# Patient Record
Sex: Female | Born: 1950 | Race: White | Hispanic: No | Marital: Married | Smoking: Never smoker
Health system: Southern US, Community
[De-identification: ages and names within clinical notes are randomized; demographics above are authoritative.]

## PROBLEM LIST (undated history)

## (undated) DIAGNOSIS — M169 Osteoarthritis of hip, unspecified: Secondary | ICD-10-CM

## (undated) DIAGNOSIS — E785 Hyperlipidemia, unspecified: Secondary | ICD-10-CM

## (undated) DIAGNOSIS — R55 Syncope and collapse: Secondary | ICD-10-CM

## (undated) HISTORY — DX: Hyperlipidemia, unspecified: E78.5

## (undated) HISTORY — DX: Osteoarthritis of hip, unspecified: M16.9

## (undated) HISTORY — PX: TOTAL HIP ARTHROPLASTY: SHX124

## (undated) HISTORY — DX: Syncope and collapse: R55

---

## 1975-06-05 HISTORY — PX: TONSILLECTOMY: SUR1361

## 1984-06-04 HISTORY — PX: OTHER SURGICAL HISTORY: SHX169

## 1999-08-24 ENCOUNTER — Encounter: Payer: Self-pay | Admitting: Family Medicine

## 2006-11-01 LAB — HM COLONOSCOPY: HM Colonoscopy: NEGATIVE

## 2008-09-22 ENCOUNTER — Ambulatory Visit: Payer: Self-pay | Admitting: Family Medicine

## 2008-09-22 DIAGNOSIS — E785 Hyperlipidemia, unspecified: Secondary | ICD-10-CM

## 2008-09-22 DIAGNOSIS — M169 Osteoarthritis of hip, unspecified: Secondary | ICD-10-CM

## 2008-11-03 ENCOUNTER — Ambulatory Visit: Payer: Self-pay | Admitting: Family Medicine

## 2009-01-06 ENCOUNTER — Encounter: Payer: Self-pay | Admitting: Family Medicine

## 2009-01-12 ENCOUNTER — Telehealth: Payer: Self-pay | Admitting: Family Medicine

## 2009-01-19 ENCOUNTER — Ambulatory Visit: Payer: Self-pay | Admitting: Family Medicine

## 2009-03-16 ENCOUNTER — Inpatient Hospital Stay (HOSPITAL_COMMUNITY): Admission: RE | Admit: 2009-03-16 | Discharge: 2009-03-19 | Payer: Self-pay | Admitting: Orthopedic Surgery

## 2009-06-17 ENCOUNTER — Telehealth: Payer: Self-pay | Admitting: Family Medicine

## 2009-06-17 ENCOUNTER — Encounter: Payer: Self-pay | Admitting: Family Medicine

## 2009-07-13 ENCOUNTER — Inpatient Hospital Stay (HOSPITAL_COMMUNITY): Admission: RE | Admit: 2009-07-13 | Discharge: 2009-07-16 | Payer: Self-pay | Admitting: Orthopedic Surgery

## 2009-08-26 ENCOUNTER — Ambulatory Visit: Payer: Self-pay | Admitting: Family Medicine

## 2009-08-26 DIAGNOSIS — S6000XA Contusion of unspecified finger without damage to nail, initial encounter: Secondary | ICD-10-CM

## 2009-12-01 LAB — HM PAP SMEAR: HM Pap smear: NORMAL

## 2010-02-02 ENCOUNTER — Ambulatory Visit: Payer: Self-pay | Admitting: Family Medicine

## 2010-02-02 DIAGNOSIS — K5900 Constipation, unspecified: Secondary | ICD-10-CM | POA: Insufficient documentation

## 2010-02-02 DIAGNOSIS — K219 Gastro-esophageal reflux disease without esophagitis: Secondary | ICD-10-CM

## 2010-07-04 NOTE — Assessment & Plan Note (Signed)
Summary: CONCERNS ABOUT POSSIBLE HERNIA // RS   Vital Signs:  Patient profile:   60 year old female Weight:      153 pounds Temp:     98.0 degrees F oral BP sitting:   138 / 92  (left arm) Cuff size:   regular  Vitals Entered By: Sid Falcon LPN (February 02, 2010 8:34 AM)  History of Present Illness: Patient seen with almost 2 month history of postprandial fullness.  She relates symptoms particularly worse at night and sometimes worse supine. She usually eats about 3-4 hours before going to bed. She has not had any nausea, vomiting, abdominal pain, hematemesis, or appetite or weight changes. No stool changes. Colonoscopy 2008.  Tends to eat heavier foods at night. No history of hiatal hernia. Occasional associated reflux symptoms. Symptoms also seem to be worse when she bends over. She has not tried any medications. Sometimes has associated increasing gas symptoms. No history of peptic ulcer disease  Occ constipation which is not new.  Doing well following bil hip replacements.  Ambulating much better.  Allergies: 1)  ! * Thimersol  Past History:  Past Medical History: Last updated: 10/25/2008 Osteoarthritis left hip Fainting spells with blood draw Hyperlipidemia  Past Surgical History: Last updated: 08/26/2009 Tonsillectomy 1977 Histogram 1986 Endometrosis 1986 Total Hip replacements 2010, 2011  Family History: Last updated: 09/22/2008 Family History of Arthritis  parent, grandparent Family History High cholesterol, patrent, grandparent Family History Hypertension, parent, grandparent  Social History: Last updated: 09/22/2008 Occupation:  housewife Married Regular exercise-yes  Risk Factors: Exercise: yes (09/22/2008) PMH-FH-SH reviewed for relevance  Review of Systems  The patient denies anorexia, fever, weight loss, hoarseness, chest pain, syncope, dyspnea on exertion, peripheral edema, prolonged cough, headaches, hemoptysis, abdominal pain, melena,  hematochezia, severe indigestion/heartburn, hematuria, and incontinence.    Physical Exam  General:  Well-developed,well-nourished,in no acute distress; alert,appropriate and cooperative throughout examination Head:  Normocephalic and atraumatic without obvious abnormalities. No apparent alopecia or balding. Mouth:  Oral mucosa and oropharynx without lesions or exudates.  Teeth in good repair. Neck:  No deformities, masses, or tenderness noted. Lungs:  Normal respiratory effort, chest expands symmetrically. Lungs are clear to auscultation, no crackles or wheezes. Heart:  Normal rate and regular rhythm. S1 and S2 normal without gallop, murmur, click, rub or other extra sounds. Abdomen:  soft, non-tender, normal bowel sounds, no distention, no masses, no guarding, no rigidity, no hepatomegaly, and no splenomegaly.     Impression & Recommendations:  Problem # 1:  ESOPHAGEAL REFLUX (ICD-530.81) discussed lifestyle changes.  Trial Nexium 40 mg with samples given.  Problem # 2:  CONSTIPATION (ICD-564.00) discussed prevention measures.  Problem # 3:  OSTEOARTHRITIS, HIPS, BILATERAL (ICD-715.95)  The following medications were removed from the medication list:    Meloxicam 15 Mg Tabs (Meloxicam) ..... One by mouth daily Her updated medication list for this problem includes:    Tramadol Hcl 50 Mg Tabs (Tramadol hcl) ..... One to two tabs by mouth q 6 hours prn  Complete Medication List: 1)  Glucosamine-chondroitin 250-500 Mg Caps (Glucosamine-chondroitin) .... 2 daily 2)  Caltrate 600+d Plus 600-400 Mg-unit Tabs (Calcium carbonate-vit d-min) .... Once daily 3)  Calcium 600/vitamin D 600-400 Mg-unit Tabs (Calcium carbonate-vitamin d) .... Once daily 4)  Tramadol Hcl 50 Mg Tabs (Tramadol hcl) .... One to two tabs by mouth q 6 hours prn 5)  Robaxin 500 Mg Tabs (Methocarbamol) .... One tab every 6 hours as needed  Patient Instructions: 1)  Avoid  eating within 4 hours of bedtime. 2)  Avoid  high fat foods at night. 3)  Nexium 40 mg one daily. 4)  We need to consider X-rays if you develop any abdmoinal pain, 5)  appetite changes, vomiting, or any unexplained weight loss.

## 2010-07-04 NOTE — Progress Notes (Signed)
Summary: Pt req letter of Medical Clearance for total rt hip replacement  Phone Note Call from Patient Call back at Home Phone [phone]   Caller: Patient Call For: Heather Peat MD Summary of Call: Pt is req to get Medical Clearance from Dr. Caryl Never, in order to have total right hip replacement surgery with Dr. Lequita Halt. Pt says that she just need a letter sent to Imelda Pillow w/ [facility] Phone # [phone] and fax # [phone] . Please advise. Please notify pt with status.   Initial call taken by: Lucy Antigua,  June 17, 2009 9:44 AM  Follow-up for Phone Call        written  Follow-up by: Heather Peat MD,  June 17, 2009 10:17 AM

## 2010-07-04 NOTE — Letter (Signed)
Summary: Generic Letter   at [facility]  [address] [place], [place] [address]   Phone: [phone]  Fax: [phone]           06/17/2009  [facility] [address] [place], [place]  [address]  Dear Ms. Kaneshiro,   Mrs. Sinatra is a patient of mine and she has recently brought to my attention that she is being scheduled for Total Hip Replacement.  As you are aware, she recently did well with the other side.  We are not aware of any contraindications for surgery at this time.  No new symptoms since her last surgery.  She did have mild post-op anemia from surgery in October and I would suggest pre-op hemoglobin.  We would be happy to assess CBC in our office if that would be helpful   Sincerely,      Evelena Peat, MD

## 2010-07-04 NOTE — Letter (Signed)
Summary: Generic Letter  [place] at [facility]  [address] [place], [place] [address]   Phone: [phone]  Fax: [phone]    06/17/2009 Regarding: Heather Adams [address] [place], [place]  [address]  To:  [facility]  [name] is a patient of mine and she has recently brought to my attention that she is being scheduled for Total Hip Replacement.  As you are aware, she recently did well with the other side.  We are not aware of any contraindications for surgery at this time.  No new symptoms since last surgery.  Did have mild post anemia from surgery in October and would suggest preop hemoglobin.  We would be happy to assess CBC in our office if that would be helpful.   Sincerely,   Evelena Peat MD

## 2010-07-04 NOTE — Letter (Signed)
Summary: Generic Letter  [place] at [facility]  [address] [place], [place] [address]   Phone: [phone]  Fax: [phone]            06/17/2009   Heather Adams [address] [place], [place]  [address]   To [facility]:  [name] is a patient of mine and she has recently brought to my attention that she is being scheduled for a Total Hip Replacement.  As you are aware, she recently did well with the other side.  We are not aware of any contraindication for surgery at this time.  No new symptoms since her last surgery.  She did have mild post-op anemia from surgery in October and I would suggest pre-op hemoglobin.  We would be happy to assess CBC in our office if that would be helpful.    Sincerely,       Evelena Peat, MD

## 2010-07-04 NOTE — Assessment & Plan Note (Signed)
Summary: SORE TOE ON L FOOT (INFECTED?) // RS   Vital Signs:  Patient profile:   60 year old female Weight:      141 pounds Temp:     98.1 degrees F oral BP sitting:   140 / 82  (left arm) Cuff size:   regular  Vitals Entered By: Sid Falcon LPN (August 26, 2009 1:56 PM) CC: left great toe pain   History of Present Illness: L great toe pain for 6 days afternoon incr yard work.  No injury.  Discoloration under nail.  No injury.  No redness.  No new shoes. Pain is minimal..  Recent THR and recovering well.  No hx diabetes.    Allergies: 1)  ! * Thimersol  Past History:  Past Medical History: Last updated: 10/25/2008 Osteoarthritis left hip Fainting spells with blood draw Hyperlipidemia  Social History: Last updated: 09/22/2008 Occupation:  housewife Married Regular exercise-yes  Past Surgical History: Tonsillectomy 1977 Histogram 1986 Endometrosis 1986 Total Hip replacements 2010, 2011 PMH reviewed for relevance, PSH reviewed for relevance  Review of Systems  The patient denies fever.    Physical Exam  General:  Well-developed,well-nourished,in no acute distress; alert,appropriate and cooperative throughout examination Lungs:  Normal respiratory effort, chest expands symmetrically. Lungs are clear to auscultation, no crackles or wheezes. Heart:  Normal rate and regular rhythm. S1 and S2 normal without gallop, murmur, click, rub or other extra sounds. Extremities:  L great toe small subungual hematoma.  Nontender.  no surrounding erythema.   Impression & Recommendations:  Problem # 1:  SUBUNGUAL HEMATOMA (ICD-923.3) reassurance.  Offered drainage but no signs of infection and no signif pain so will observe.  Complete Medication List: 1)  Meloxicam 15 Mg Tabs (Meloxicam) .... One by mouth daily 2)  Glucosamine-chondroitin 250-500 Mg Caps (Glucosamine-chondroitin) .... 2 daily 3)  Caltrate 600+d Plus 600-400 Mg-unit Tabs (Calcium carbonate-vit d-min) ....  Once daily 4)  Calcium 600/vitamin D 600-400 Mg-unit Tabs (Calcium carbonate-vitamin d) .... Once daily 5)  Tramadol Hcl 50 Mg Tabs (Tramadol hcl) .... One to two tabs by mouth q 6 hours prn 6)  Robaxin 500 Mg Tabs (Methocarbamol) .... One tab every 6 hours as needed  Patient Instructions: 1)  Follow up if you notice any signs of infection such as redness or increased pain.

## 2010-08-23 LAB — CBC
HCT: 28.8 % — ABNORMAL LOW (ref 36.0–46.0)
HCT: 39.3 % (ref 36.0–46.0)
Hemoglobin: 11 g/dL — ABNORMAL LOW (ref 12.0–15.0)
Hemoglobin: 9.6 g/dL — ABNORMAL LOW (ref 12.0–15.0)
MCHC: 33.9 g/dL (ref 30.0–36.0)
MCHC: 34.8 g/dL (ref 30.0–36.0)
MCV: 87.5 fL (ref 78.0–100.0)
MCV: 87.9 fL (ref 78.0–100.0)
Platelets: 192 10*3/uL (ref 150–400)
Platelets: 195 10*3/uL (ref 150–400)
Platelets: 255 10*3/uL (ref 150–400)
RBC: 3.31 MIL/uL — ABNORMAL LOW (ref 3.87–5.11)
RBC: 3.67 MIL/uL — ABNORMAL LOW (ref 3.87–5.11)
RDW: 14.4 % (ref 11.5–15.5)
WBC: 4.4 10*3/uL (ref 4.0–10.5)
WBC: 7 10*3/uL (ref 4.0–10.5)

## 2010-08-23 LAB — URINALYSIS, ROUTINE W REFLEX MICROSCOPIC
Nitrite: NEGATIVE
Protein, ur: NEGATIVE mg/dL
Specific Gravity, Urine: 1.01 (ref 1.005–1.030)
Urobilinogen, UA: 0.2 mg/dL (ref 0.0–1.0)

## 2010-08-23 LAB — BASIC METABOLIC PANEL
BUN: 7 mg/dL (ref 6–23)
BUN: 7 mg/dL (ref 6–23)
Calcium: 8.1 mg/dL — ABNORMAL LOW (ref 8.4–10.5)
Calcium: 8.5 mg/dL (ref 8.4–10.5)
Chloride: 104 mEq/L (ref 96–112)
Chloride: 107 mEq/L (ref 96–112)
GFR calc Af Amer: >60 mL/min (ref >60)
Glucose, Bld: 120 mg/dL — ABNORMAL HIGH (ref 70–99)
Glucose, Bld: 164 mg/dL — ABNORMAL HIGH (ref 70–99)
Potassium: 4.2 mEq/L (ref 3.5–5.1)

## 2010-08-23 LAB — COMPREHENSIVE METABOLIC PANEL
AST: 19 U/L (ref 0–37)
BUN: 10 mg/dL (ref 6–23)
CO2: 28 mEq/L (ref 19–32)
Calcium: 9.5 mg/dL (ref 8.4–10.5)
Creatinine, Ser: 0.77 mg/dL (ref 0.4–1.2)
Glucose, Bld: 74 mg/dL (ref 70–99)
Potassium: 4.3 mEq/L (ref 3.5–5.1)
Sodium: 138 mEq/L (ref 135–145)
Total Protein: 7.5 g/dL (ref 6.0–8.3)

## 2010-08-23 LAB — URINE MICROSCOPIC-ADD ON

## 2010-08-23 LAB — PROTIME-INR
INR: 1.48 (ref 0.00–1.49)
Prothrombin Time: 12.9 seconds (ref 11.6–15.2)
Prothrombin Time: 14 seconds (ref 11.6–15.2)
Prothrombin Time: 17.8 seconds — ABNORMAL HIGH (ref 11.6–15.2)

## 2010-08-23 LAB — TYPE AND SCREEN

## 2010-08-23 LAB — APTT: aPTT: 27 seconds (ref 24–37)

## 2010-09-07 LAB — COMPREHENSIVE METABOLIC PANEL
AST: 20 U/L (ref 0–37)
Albumin: 4.3 g/dL (ref 3.5–5.2)
Calcium: 9.8 mg/dL (ref 8.4–10.5)
Chloride: 104 mEq/L (ref 96–112)
Creatinine, Ser: 0.84 mg/dL (ref 0.4–1.2)
GFR calc Af Amer: >60 mL/min (ref >60)
Total Bilirubin: 1.1 mg/dL (ref 0.3–1.2)

## 2010-09-07 LAB — CBC
HCT: 28.6 % — ABNORMAL LOW (ref 36.0–46.0)
HCT: 29.3 % — ABNORMAL LOW (ref 36.0–46.0)
Hemoglobin: 9.7 g/dL — ABNORMAL LOW (ref 12.0–15.0)
Hemoglobin: 10.1 g/dL — ABNORMAL LOW (ref 12.0–15.0)
MCHC: 34.4 g/dL (ref 30.0–36.0)
MCV: 90.5 fL (ref 78.0–100.0)
MCV: 90.2 fL (ref 78.0–100.0)
MCV: 90.3 fL (ref 78.0–100.0)
MCV: 90.4 fL (ref 78.0–100.0)
Platelets: 177 10*3/uL (ref 150–400)
Platelets: 266 10*3/uL (ref 150–400)
RDW: 12.7 % (ref 11.5–15.5)
RDW: 13.1 % (ref 11.5–15.5)
WBC: 4.6 10*3/uL (ref 4.0–10.5)
WBC: 5.2 10*3/uL (ref 4.0–10.5)

## 2010-09-07 LAB — PROTIME-INR
INR: 1.86 — ABNORMAL HIGH (ref 0.00–1.49)
INR: 1.59 — ABNORMAL HIGH (ref 0.00–1.49)
Prothrombin Time: 21.3 seconds — ABNORMAL HIGH (ref 11.6–15.2)

## 2010-09-07 LAB — URINALYSIS, ROUTINE W REFLEX MICROSCOPIC
Nitrite: NEGATIVE
Specific Gravity, Urine: 1.014 (ref 1.005–1.030)
pH: 5.5 (ref 5.0–8.0)

## 2010-09-07 LAB — URINE MICROSCOPIC-ADD ON

## 2010-09-07 LAB — BASIC METABOLIC PANEL
BUN: 5 mg/dL — ABNORMAL LOW (ref 6–23)
CO2: 27 mEq/L (ref 19–32)
Calcium: 8.2 mg/dL — ABNORMAL LOW (ref 8.4–10.5)
Chloride: 103 mEq/L (ref 96–112)
Chloride: 108 mEq/L (ref 96–112)
Glucose, Bld: 120 mg/dL — ABNORMAL HIGH (ref 70–99)
Glucose, Bld: 144 mg/dL — ABNORMAL HIGH (ref 70–99)
Potassium: 4.7 mEq/L (ref 3.5–5.1)
Sodium: 137 mEq/L (ref 135–145)

## 2010-09-07 LAB — APTT: aPTT: 25 seconds (ref 24–37)

## 2010-09-20 ENCOUNTER — Other Ambulatory Visit (INDEPENDENT_AMBULATORY_CARE_PROVIDER_SITE_OTHER): Payer: BC Managed Care – PPO | Admitting: Family Medicine

## 2010-09-20 DIAGNOSIS — Z0000 Encounter for general adult medical examination without abnormal findings: Secondary | ICD-10-CM

## 2010-09-20 DIAGNOSIS — E785 Hyperlipidemia, unspecified: Secondary | ICD-10-CM

## 2010-09-20 LAB — CBC WITH DIFFERENTIAL/PLATELET
Basophils Absolute: 0.1 10*3/uL (ref 0.0–0.1)
Eosinophils Absolute: 0.1 10*3/uL (ref 0.0–0.7)
Lymphocytes Relative: 37.2 % (ref 12.0–46.0)
MCHC: 34.2 g/dL (ref 30.0–36.0)
MCV: 89.2 fl (ref 78.0–100.0)
Monocytes Absolute: 0.3 10*3/uL (ref 0.1–1.0)
Neutrophils Relative %: 50.8 % (ref 43.0–77.0)
Platelets: 240 10*3/uL (ref 150.0–400.0)
RBC: 4.41 Mil/uL (ref 3.87–5.11)
RDW: 13.9 % (ref 11.5–14.6)

## 2010-09-20 LAB — BASIC METABOLIC PANEL
BUN: 12 mg/dL (ref 6–23)
CO2: 27 mEq/L (ref 19–32)
Calcium: 9.3 mg/dL (ref 8.4–10.5)
Creatinine, Ser: 0.9 mg/dL (ref 0.4–1.2)
Glucose, Bld: 80 mg/dL (ref 70–99)

## 2010-09-20 LAB — LIPID PANEL
HDL: 47.2 mg/dL (ref >39.00)
Triglycerides: 156 mg/dL — ABNORMAL HIGH (ref 0.0–149.0)
VLDL: 31.2 mg/dL (ref 0.0–40.0)

## 2010-09-20 LAB — POCT URINALYSIS DIPSTICK
Ketones, UA: NEGATIVE
Protein, UA: NEGATIVE
Spec Grav, UA: 1.025
Urobilinogen, UA: 0.2
pH, UA: 5

## 2010-09-20 LAB — HEPATIC FUNCTION PANEL
Alkaline Phosphatase: 63 U/L (ref 39–117)
Bilirubin, Direct: 0.1 mg/dL (ref 0.0–0.3)

## 2010-09-20 LAB — TSH: TSH: 1.67 u[IU]/mL (ref 0.35–5.50)

## 2010-10-02 ENCOUNTER — Ambulatory Visit (INDEPENDENT_AMBULATORY_CARE_PROVIDER_SITE_OTHER): Payer: BC Managed Care – PPO | Admitting: Family Medicine

## 2010-10-02 ENCOUNTER — Encounter: Payer: Self-pay | Admitting: Family Medicine

## 2010-10-02 DIAGNOSIS — Z0000 Encounter for general adult medical examination without abnormal findings: Secondary | ICD-10-CM

## 2010-10-02 MED ORDER — TETANUS-DIPHTH-ACELL PERTUSSIS 5-2.5-18.5 LF-MCG/0.5 IM SUSP: 0.5000 mL | Freq: Once | INTRAMUSCULAR | Status: DC

## 2010-10-02 NOTE — Progress Notes (Signed)
  Subjective:    Patient ID: Heather Adams, female    DOB: 09/04/1950, 60 y.o.   MRN: [id number]  HPI Patient seen for complete physical examination. She has had degenerative arthritis both hips and prior total hip replacements. Starting to exercise more. Colonoscopy 2008. She sees gynecologist regularly. Pap smear and mammogram last June reportedly normal. She takes no prescription medications. We're trying to confirm last tetanus at this time. No indications for Pneumovax yet.   Review of Systems  Constitutional: Negative for fever, activity change, appetite change and fatigue.  HENT: Negative for hearing loss, ear pain, sore throat and trouble swallowing.   Eyes: Negative for visual disturbance.  Respiratory: Negative for cough and shortness of breath.   Cardiovascular: Negative for chest pain and palpitations.  Gastrointestinal: Negative for abdominal pain, diarrhea, constipation and blood in stool.  Genitourinary: Negative for dysuria and hematuria.  Musculoskeletal: Negative for myalgias, back pain and arthralgias.  Skin: Negative for rash.  Neurological: Negative for dizziness, syncope and headaches.  Hematological: Negative for adenopathy.  Psychiatric/Behavioral: Negative for confusion and dysphoric mood.       Objective:   Physical Exam  Constitutional: She is oriented to person, place, and time. She appears well-developed and well-nourished.  HENT:  Head: Normocephalic and atraumatic.  Eyes: EOM are normal. Pupils are equal, round, and reactive to light.  Neck: Normal range of motion. Neck supple. No thyromegaly present.  Cardiovascular: Normal rate, regular rhythm and normal heart sounds.   No murmur heard. Pulmonary/Chest: Breath sounds normal. No respiratory distress. She has no wheezes. She has no rales.  Abdominal: Soft. Bowel sounds are normal. She exhibits no distension and no mass. There is no tenderness. There is no rebound and no guarding.  Genitourinary:   per GYN  Musculoskeletal: Normal range of motion. She exhibits no edema.  Lymphadenopathy:    She has no cervical adenopathy.  Neurological: She is alert and oriented to person, place, and time. She displays normal reflexes. No cranial nerve deficit.  Skin: No rash noted.  Psychiatric: She has a normal mood and affect. Her behavior is normal. Judgment and thought content normal.          Assessment & Plan:  Complete physical. Labs reviewed with patient. Elevated lipids. She prefers to work on weight loss and reduction of saturated fats and repeat lipids in 6 months. Confirm date of last tetanus.

## 2010-10-02 NOTE — Patient Instructions (Signed)
Low-Fat, Low-Saturated-Fat, Low-Cholesterol Diets Food Selection Guide BREADS, CEREALS, PASTA, RICE, DRIED PEAS, AND BEANS These products are high in carbohydrates and most are low in fat. Therefore, they can be increased in the diet as substitutes for fatty foods. They too, however, contain calories and should not be eaten in excess. Cereals can be eaten for snacks as well as for breakfast.  Include foods that contain fiber (fruits, vegetables, whole grains, and legumes). Research shows that fiber may lower blood cholesterol levels, especially the water-soluble fiber found in fruits, vegetables, oat products, and legumes. FRUITS AND VEGETABLES It is good to eat fruits and vegetables. Besides being sources of fiber, both are rich in vitamins and some minerals. They help you get the daily allowances of these nutrients. Fruits and vegetables can be used for snacks and desserts. MEATS Limit lean meat, chicken, [place], and fish to no more than 6 ounces per day. Beef, Pork, and Lamb  Use lean cuts of beef, pork, and lamb. Lean cuts include:   Extra-lean ground beef.   Arm roast.   Sirloin tip.   Center-cut ham.   Round steak.   Loin chops.   Rump roast.   Tenderloin.  Trim all fat off the outside of meats before cooking. It is not necessary to severely decrease the intake of red meat, but lean choices should be made. Lean meat is rich in protein and contains a highly absorbable form of iron. Premenopausal women, in particular, should avoid reducing lean red meat because this could increase the risk for low red blood cells (iron-deficiency anemia). Processed Meats Processed meats, such as bacon, bologna, salami, sausage, and hot dogs contain large quantities of fat, are not rich in valuable nutrients, and should not be eaten very often. Organ Meats The organ meats, such as liver, sweetbreads, kidneys, and brain are very rich in cholesterol. They should be limited. Chicken and [place] These  are good sources of protein. The fat of poultry can be reduced by removing the skin and underlying fat layers before cooking. Chicken and [place] can be substituted for lean red meat in the diet. Poultry should not be fried or covered with high-fat sauces. Fish and Shellfish Fish is a good source of protein. Shellfish contain cholesterol, but they usually are low in saturated fatty acids. The preparation of fish is important. Like chicken and [place], they should not be fried or covered with high-fat sauces. EGGS Egg yolks often are hidden in cooked and processed foods. Egg whites contain no fat or cholesterol. They can be eaten often. Try 1 to 2 egg whites instead of whole eggs in recipes or use egg substitutes that do not contain yolk. MILK AND DAIRY PRODUCTS Use skim or 1% milk instead of 2% or whole milk. Decrease whole milk, natural, and processed cheeses. Use nonfat or low-fat (2%) cottage cheese or low-fat cheeses made from vegetable oils. Choose nonfat or low-fat (1 to 2%) yogurt. Experiment with evaporated skim milk in recipes that call for heavy cream. Substitute low-fat yogurt or low-fat cottage cheese for sour cream in dips and salad dressings. Have at least 2 servings of low-fat dairy products, such as 2 glasses of skim (or 1%) milk each day to help get your daily calcium intake. FATS AND OILS Reduce the total intake of fats, especially saturated fat. Butterfat, lard, and beef fats are high in saturated fat and cholesterol. These should be avoided as much as possible. Vegetable fats do not contain cholesterol, but certain vegetable fats, such as  coconut oil, palm oil, and palm kernel oil are very high in saturated fats. These should be limited. These fats are often used in bakery goods, processed foods, popcorn, oils, and nondairy creamers. Vegetable shortenings and some peanut butters contain hydrogenated oils, which are also saturated fats. Read the labels on these foods and check for saturated  vegetable oils. Unsaturated vegetable oils and fats do not raise blood cholesterol. However, they should be limited because they are fats and are high in calories. Total fat should still be limited to 30% of your daily caloric intake. Desirable liquid vegetable oils are corn oil, cottonseed oil, olive oil, canola oil, safflower oil, soybean oil, and sunflower oil. Peanut oil is not as good, but small amounts are acceptable. Buy a heart-healthy tub margarine that has no partially hydrogenated oils in the ingredients. Mayonnaise and salad dressings often are made from unsaturated fats, but they should also be limited because of their high calorie and fat content. Seeds, nuts, peanut butter, olives, and avocados are high in fat, but the fat is mainly the unsaturated type. These foods should be limited mainly to avoid excess calories and fat. OTHER EATING TIPS Snacks  Most sweets should be limited as snacks. They tend to be rich in calories and fats, and their caloric content outweighs their nutritional value. Some good choices in snacks are graham crackers, melba toast, soda crackers, bagels (no egg), English muffins, fruits, and vegetables. These snacks are preferable to snack crackers, [place] fries, and chips. Popcorn should be air-popped or cooked in small amounts of liquid vegetable oil. Desserts Eat fruit, low-fat yogurt, and fruit ices instead of pastries, cake, and cookies. Sherbet, angel food cake, gelatin dessert, frozen low-fat yogurt, or other frozen products that do not contain saturated fat (pure fruit juice bars, frozen ice pops) are also acceptable.  COOKING METHODS Choose those methods that use little or no fat. They include:  Poaching.   Braising.   Steaming.   Grilling.   Baking.   Stir-frying.   Broiling.   Microwaving.  Foods can be cooked in a nonstick pan without added fat, or use a nonfat cooking spray in regular cookware. Limit fried foods and avoid frying in saturated  fat. Add moisture to lean meats by using water, broth, cooking wines, and other nonfat or low-fat sauces along with the cooking methods mentioned above. Soups and stews should be chilled after cooking. The fat that forms on top after a few hours in the refrigerator should be skimmed off. When preparing meals, avoid using excess salt. Salt can contribute to raising blood pressure in some people. EATING AWAY FROM HOME Order entres, potatoes, and vegetables without sauces or butter. When meat exceeds the size of a deck of cards (3 to 4 ounces), the rest can be taken home for another meal. Choose vegetable or fruit salads and ask for low-calorie salad dressings to be served on the side. Use dressings sparingly. Limit high-fat toppings, such as bacon, crumbled eggs, cheese, sunflower seeds, and olives. Ask for heart-healthy tub margarine instead of butter. Document Released: 11/10/2001 Document Re-Released: 08/15/2009 [facility] Patient Information 2011 [place], [place].  Consider repeat lipids in 6 months.

## 2010-11-11 IMAGING — CR DG HIP 1V PORT*L*
1 series · 1 of 1 positions shown · non-contrast
Comparison: 03/10/2009

CLINICAL DATA: OA left hip. left THA

PORTABLE LEFT HIP - 1 VIEW

[view not recorded]
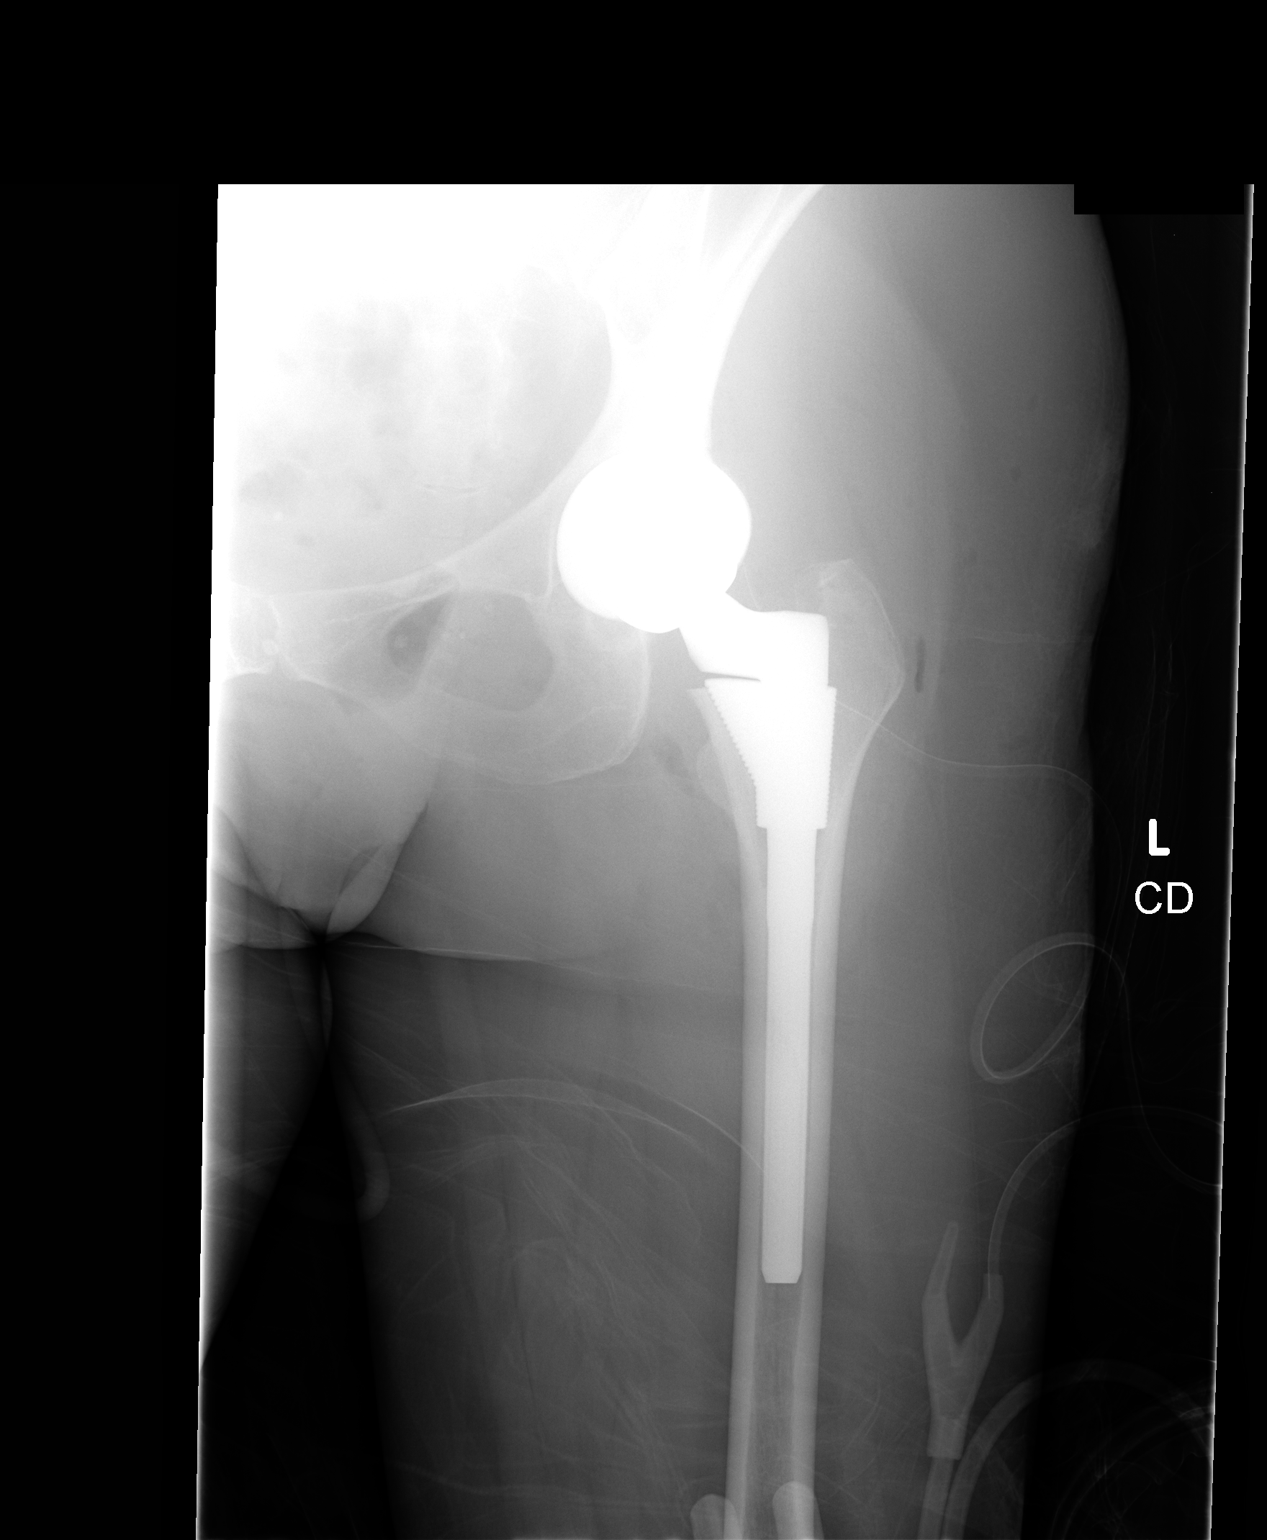

[1 of 1 positions shown; findings below may reference images not displayed]

FINDINGS: Left acetabular and femoral prosthetic components appear
in satisfactory position and alignment.  Radiopaque drain is noted.
IMPRESSION: Satisfactory appearance of left total hip replacement.

## 2011-02-09 ENCOUNTER — Encounter: Payer: Self-pay | Admitting: Family Medicine

## 2011-02-09 ENCOUNTER — Ambulatory Visit (INDEPENDENT_AMBULATORY_CARE_PROVIDER_SITE_OTHER): Payer: BC Managed Care – PPO | Admitting: Family Medicine

## 2011-02-09 VITALS — BP 120/80 | Temp 98.5°F | Wt 140.0 lb

## 2011-02-09 DIAGNOSIS — J310 Chronic rhinitis: Secondary | ICD-10-CM

## 2011-02-09 DIAGNOSIS — J3489 Other specified disorders of nose and nasal sinuses: Secondary | ICD-10-CM

## 2011-02-09 MED ORDER — AMOXICILLIN 875 MG PO TABS: 875.0000 mg | ORAL_TABLET | Freq: Two times a day (BID) | ORAL | Status: AC

## 2011-02-09 NOTE — Progress Notes (Signed)
  Subjective:    Patient ID: Heather Adams, female    DOB: 1951/01/06, 60 y.o.   MRN: [id number]  HPI Patient presents with some intermittent sinus issues over the past few weeks. Occasional left ear pain. She's had occasional pain around the left eye improved with Sudafed. She has postnasal drainage and occasional sneezing. No fever. No purulent nasal discharge. No significant cough.  Review of Systems  Constitutional: Negative for fever and chills.  HENT: Positive for congestion, sneezing, postnasal drip and sinus pressure.   Respiratory: Negative for cough, shortness of breath and wheezing.        Objective:   Physical Exam  Constitutional: She appears well-developed and well-nourished.  HENT:  Right Ear: External ear normal.  Left Ear: External ear normal.  Nose: Nose normal.  Mouth/Throat: Oropharynx is clear and moist.  Neck: Neck supple.  Cardiovascular: Normal rate and regular rhythm.   Pulmonary/Chest: Effort normal and breath sounds normal. No respiratory distress. She has no wheezes. She has no rales.  Lymphadenopathy:    She has no cervical adenopathy.          Assessment & Plan:  Probable eustachian tube dysfunction and allergic rhinitis. Continue antihistamine and Sudafed as needed. Followup promptly for signs of secondary infection

## 2011-02-09 NOTE — Patient Instructions (Signed)
Continue with over the counter sudafed and consider addition of Allegra or Zyrtec. Start Amoxicillin for any fever, pus like drainage from nose, or any localizing facial pain.

## 2011-03-10 IMAGING — CR DG PORTABLE PELVIS
1 series · 1 of 1 positions shown · non-contrast
Comparison: 03/16/2009

CLINICAL DATA: Right hip arthroplasty

PORTABLE PELVIS

[view not recorded]
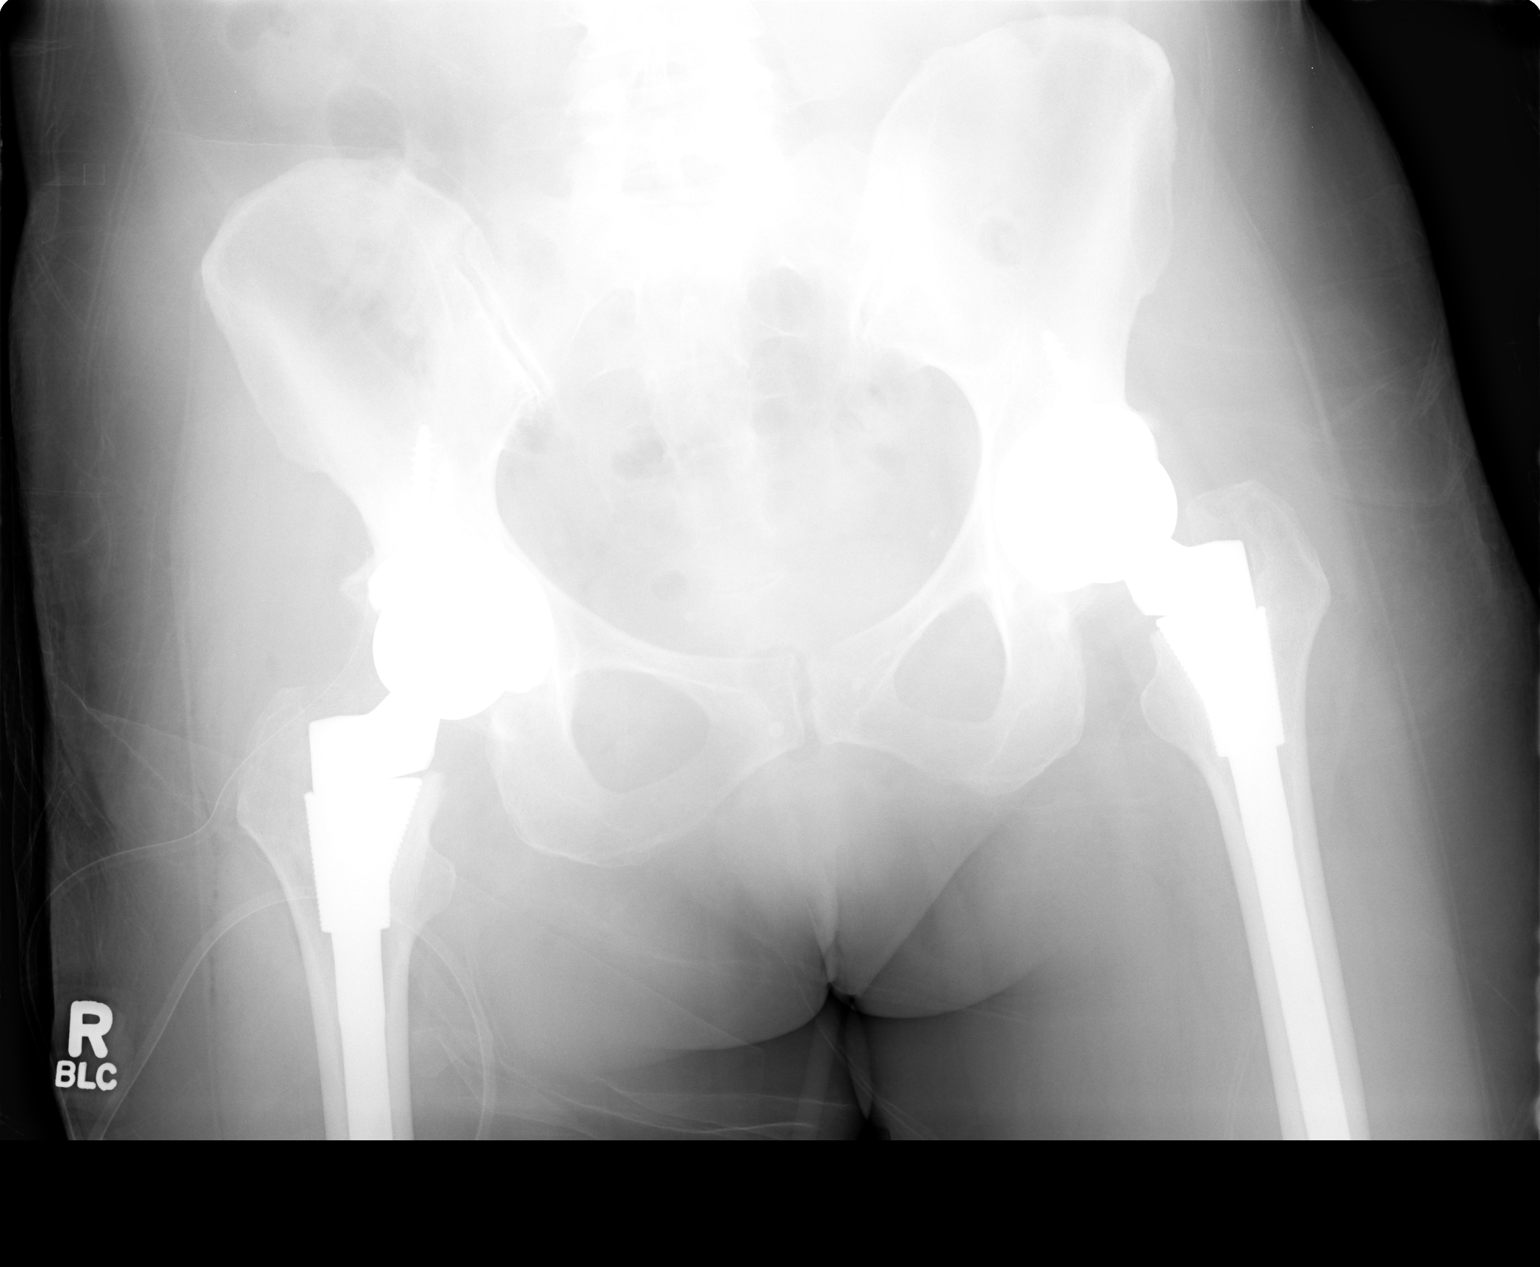

[1 of 1 positions shown; findings below may reference images not displayed]

FINDINGS: Changes of interval right hip arthroplasty.  Femoral and
acetabular components on the right articulate normally in this
single projection.  The distal tip of femoral stem is not
visualized on the pelvic radiograph.  Left hip arthroplasty
components are stable.
IMPRESSION: 1.  Interval right hip arthroplasty without apparent complication.

## 2011-06-08 ENCOUNTER — Ambulatory Visit (INDEPENDENT_AMBULATORY_CARE_PROVIDER_SITE_OTHER): Payer: BC Managed Care – PPO | Admitting: Family Medicine

## 2011-06-08 ENCOUNTER — Encounter: Payer: Self-pay | Admitting: Family Medicine

## 2011-06-08 VITALS — BP 130/92 | Temp 98.1°F | Wt 142.0 lb

## 2011-06-08 DIAGNOSIS — R1013 Epigastric pain: Secondary | ICD-10-CM

## 2011-06-08 DIAGNOSIS — K219 Gastro-esophageal reflux disease without esophagitis: Secondary | ICD-10-CM

## 2011-06-08 MED ORDER — PANTOPRAZOLE SODIUM 40 MG PO TBEC: 40.0000 mg | DELAYED_RELEASE_TABLET | Freq: Every day | ORAL | Status: DC

## 2011-06-08 NOTE — Patient Instructions (Signed)
Diet for GERD or PUD Nutrition therapy can help ease the discomfort of gastroesophageal reflux disease (GERD) and peptic ulcer disease (PUD).  HOME CARE INSTRUCTIONS   Eat your meals slowly, in a relaxed setting.   Eat 5 to 6 small meals per day.   If a food causes distress, stop eating it for a period of time.  FOODS TO AVOID  Coffee, regular or decaffeinated.   Cola beverages, regular or low calorie.   Tea, regular or decaffeinated.   Pepper.   Cocoa.   High fat foods, including meats.   Butter, margarine, hydrogenated oil (trans fats).   Peppermint or spearmint (if you have GERD).   Fruits and vegetables if not tolerated.   Alcohol.   Nicotine (smoking or chewing). This is one of the most potent stimulants to acid production in the gastrointestinal tract.   Any food that seems to aggravate your condition.  If you have questions regarding your diet, ask your caregiver or a registered dietitian. TIPS  Lying flat may make symptoms worse. Keep the head of your bed raised 6 to 9 inches (15 to 23 cm) by using a foam wedge or blocks under the legs of the bed.   Do not lay down until 3 hours after eating a meal.   Daily physical activity may help reduce symptoms.  MAKE SURE YOU:   Understand these instructions.   Will watch your condition.   Will get help right away if you are not doing well or get worse.  Document Released: 05/21/2005 Document Revised: 01/31/2011 Document Reviewed: 10/04/2008 [employer] Patient Information 2012 [employer]. 

## 2011-06-08 NOTE — Progress Notes (Signed)
  Subjective:    Patient ID: Heather Adams, female    DOB: 10-Aug-1950, 61 y.o.   MRN: [id number]  HPI  Concern for possible symptomatic hiatal hernia raised by patiet. Last weekend patient developed some epigastric pain. Described as achy quality. Very transient. Some radiation toward back. No chest pain. Improved following Nexium which was left over. She denies any melena. No nausea or vomiting. No recent nonsteroidal use. No exertional chest pain. No change of bowel habits. Denies melena. Reportedly history of hiatal hernia. No right upper quadrant pain. No symptoms past few days. Minimal caffeine use. No regular alcohol use.   Review of Systems  Constitutional: Negative for appetite change and unexpected weight change.  Respiratory: Negative for cough and shortness of breath.   Cardiovascular: Negative for chest pain, palpitations and leg swelling.  Gastrointestinal: Negative for blood in stool.  Neurological: Negative for dizziness.       Objective:   Physical Exam  Constitutional: She appears well-developed and well-nourished.  HENT:  Mouth/Throat: Oropharynx is clear and moist.  Neck: Neck supple.  Cardiovascular: Normal rate and regular rhythm.   Pulmonary/Chest: Effort normal and breath sounds normal. No respiratory distress. She has no wheezes. She has no rales.  Abdominal: Soft. Bowel sounds are normal. She exhibits no distension and no mass. There is no tenderness. There is no rebound and no guarding.  Lymphadenopathy:    She has no cervical adenopathy.          Assessment & Plan:  Transient epigastric pain. Question GERD. No evidence for cardiac etiology by history. Bland diet recommended. Trial of Protonix 40 mg daily for the next couple weeks. Follow up promptly for any recurrent symptoms or new symptoms

## 2011-11-28 LAB — HM PAP SMEAR: HM Pap smear: NEGATIVE

## 2012-01-17 HISTORY — PX: COLONOSCOPY: SHX174

## 2012-03-11 ENCOUNTER — Encounter: Payer: Self-pay | Admitting: Family Medicine

## 2012-03-11 ENCOUNTER — Ambulatory Visit (INDEPENDENT_AMBULATORY_CARE_PROVIDER_SITE_OTHER): Payer: BC Managed Care – PPO | Admitting: Family Medicine

## 2012-03-11 VITALS — BP 122/70 | Temp 98.0°F | Wt 140.0 lb

## 2012-03-11 DIAGNOSIS — M79675 Pain in left toe(s): Secondary | ICD-10-CM

## 2012-03-11 DIAGNOSIS — Z23 Encounter for immunization: Secondary | ICD-10-CM

## 2012-03-11 DIAGNOSIS — M79609 Pain in unspecified limb: Secondary | ICD-10-CM

## 2012-03-11 NOTE — Patient Instructions (Addendum)
Follow up for any increased toe redness, increased swelling, or any purulent drainage.

## 2012-03-11 NOTE — Progress Notes (Signed)
  Subjective:    Patient ID: Heather Adams, female    DOB: [birth year], 61 y.o.   MRN: [id number]  HPI  Left great toe pain. Yesterday she stubbed her toe with some mild loosening of the nail. No hematoma. Ambulating without difficulty. She has noticed some clear drainage today. No increased redness. No increased warmth. Tetanus last year.   Review of Systems  Constitutional: Negative for fever and chills.       Objective:   Physical Exam  Constitutional: She appears well-developed and well-nourished.  Cardiovascular: Normal rate and regular rhythm.   Musculoskeletal:       Left great toe reveals no evidence for subungual hematoma. No surrounding erythema or warmth involving the skin. No evidence for paronychia. Her toenail appears to be anchored fairly well. Minimal tenderness around nail matrix region.          Assessment & Plan:  Left great toenail pain. No evidence for subungual hematoma. No secondary infection. Hopefully, nail will remain attached. Followup promptly for signs secondary infection. Influenza vaccine given.

## 2012-08-26 ENCOUNTER — Other Ambulatory Visit (INDEPENDENT_AMBULATORY_CARE_PROVIDER_SITE_OTHER): Payer: Self-pay

## 2012-08-26 DIAGNOSIS — Z0000 Encounter for general adult medical examination without abnormal findings: Secondary | ICD-10-CM

## 2012-08-26 LAB — HEPATIC FUNCTION PANEL
ALT: 19 U/L (ref 0–35)
Alkaline Phosphatase: 62 U/L (ref 39–117)
Bilirubin, Direct: 0.1 mg/dL (ref 0.0–0.3)
Total Protein: 7.8 g/dL (ref 6.0–8.3)

## 2012-08-26 LAB — CBC WITH DIFFERENTIAL/PLATELET
Basophils Relative: 1.2 % (ref 0.0–3.0)
Eosinophils Absolute: 0.1 10*3/uL (ref 0.0–0.7)
Eosinophils Relative: 1.6 % (ref 0.0–5.0)
HCT: 42.5 % (ref 36.0–46.0)
Lymphs Abs: 1.4 10*3/uL (ref 0.7–4.0)
MCHC: 33.3 g/dL (ref 30.0–36.0)
MCV: 88.6 fl (ref 78.0–100.0)
Monocytes Absolute: 0.3 10*3/uL (ref 0.1–1.0)
Neutro Abs: 2.6 10*3/uL (ref 1.4–7.7)
RBC: 4.79 Mil/uL (ref 3.87–5.11)
WBC: 4.6 10*3/uL (ref 4.5–10.5)

## 2012-08-26 LAB — LIPID PANEL
Cholesterol: 310 mg/dL — ABNORMAL HIGH (ref 0–200)
HDL: 48.4 mg/dL (ref >39.00)
Total CHOL/HDL Ratio: 6
VLDL: 26 mg/dL (ref 0.0–40.0)

## 2012-08-26 LAB — BASIC METABOLIC PANEL
CO2: 27 mEq/L (ref 19–32)
Chloride: 102 mEq/L (ref 96–112)
Creatinine, Ser: 0.9 mg/dL (ref 0.4–1.2)
Potassium: 4.9 mEq/L (ref 3.5–5.1)

## 2012-08-26 LAB — POCT URINALYSIS DIPSTICK
Ketones, UA: NEGATIVE
Protein, UA: NEGATIVE
Spec Grav, UA: <=1.005

## 2012-08-27 ENCOUNTER — Encounter: Payer: Self-pay | Admitting: Family Medicine

## 2012-08-27 ENCOUNTER — Ambulatory Visit (INDEPENDENT_AMBULATORY_CARE_PROVIDER_SITE_OTHER): Payer: BC Managed Care – PPO | Admitting: Family Medicine

## 2012-08-27 VITALS — BP 130/70 | HR 72 | Temp 97.8°F | Resp 12 | Ht 61.75 in | Wt 130.0 lb

## 2012-08-27 DIAGNOSIS — Z0000 Encounter for general adult medical examination without abnormal findings: Secondary | ICD-10-CM

## 2012-08-27 NOTE — Progress Notes (Signed)
Subjective:    Patient ID: Heather Adams, female    DOB: 03-17-1951, 62 y.o.   MRN: [id number]  HPI Here for complete physical. She has history of hyperlipidemia and GERD Never treated for hyperlipidemia. Overall low risk for CAD. Patient had recent screening through her work with abdominal aortic aneurysm screening and carotid duplex screening which did not show any evidence for vascular disease.  Nonsmoker. No history of diabetes. No history of hypertension. Exercises regularly. Colonoscopy August 2013. No history of shingles vaccine. Sees gynecologist regularly. Pap smear within the past year normal. Gets yearly mammograms. Tetanus is up-to-date  History of osteoarthritis and total hip replacements previously. She has recovered very well and stays quite active.  Past Medical History  Diagnosis Date  . Osteoarthritis of hip     right  . Fainting spell     with blood draws   . Hyperlipidemia    Past Surgical History  Procedure Laterality Date  . Tonsillectomy  1977  . Hisogram  1986  . Endometrosis  1986  . Total hip arthroplasty  2010,2011    reports that she has never smoked. She does not have any smokeless tobacco history on file. Her alcohol and drug histories are not on file. family history includes Hearing loss in her paternal aunt, paternal grandfather, and paternal uncle; Heart disease (age of onset: 92) in her paternal grandfather; and Hyperlipidemia in her mother. Allergies  Allergen Reactions  . Thimerosal       Review of Systems  Constitutional: Negative for fever, activity change, appetite change, fatigue and unexpected weight change.  HENT: Negative for hearing loss, ear pain, sore throat and trouble swallowing.   Eyes: Negative for visual disturbance.  Respiratory: Negative for cough and shortness of breath.   Cardiovascular: Negative for chest pain and palpitations.  Gastrointestinal: Negative for abdominal pain, diarrhea, constipation and blood in  stool.  Genitourinary: Negative for dysuria and hematuria.  Musculoskeletal: Negative for myalgias, back pain and arthralgias.  Skin: Negative for rash.  Neurological: Negative for dizziness, syncope and headaches.  Hematological: Negative for adenopathy.  Psychiatric/Behavioral: Negative for confusion and dysphoric mood.       Objective:   Physical Exam  Constitutional: She is oriented to person, place, and time. She appears well-developed and well-nourished.  HENT:  Head: Normocephalic and atraumatic.  Eyes: EOM are normal. Pupils are equal, round, and reactive to light.  Neck: Normal range of motion. Neck supple. No thyromegaly present.  Cardiovascular: Normal rate, regular rhythm and normal heart sounds.   No murmur heard. Pulmonary/Chest: Breath sounds normal. No respiratory distress. She has no wheezes. She has no rales.  Abdominal: Soft. Bowel sounds are normal. She exhibits no distension and no mass. There is no tenderness. There is no rebound and no guarding.  Genitourinary:  Per GYN  Musculoskeletal: Normal range of motion. She exhibits no edema.  Lymphadenopathy:    She has no cervical adenopathy.  Neurological: She is alert and oriented to person, place, and time. She displays normal reflexes. No cranial nerve deficit.  Skin: No rash noted.  Psychiatric: She has a normal mood and affect. Her behavior is normal. Judgment and thought content normal.          Assessment & Plan:  Complete physical. Reviewed labs. She has very high lipids but overall low risk for CAD. Computed 10 year risk of CAD - 4%. We discussed pros and cons of statin therapy at this point she is undecided. Consider shingles vaccine and she'll  check on coverage

## 2012-08-27 NOTE — Patient Instructions (Addendum)
Check on insurance coverage for shingles vaccine and call us to set up if interested    Fat and Cholesterol Control Diet Cholesterol levels in your body are determined significantly by your diet. Cholesterol levels may also be related to heart disease. The following material helps to explain this relationship and discusses what you can do to help keep your heart healthy. Not all cholesterol is bad. Low-density lipoprotein (LDL) cholesterol is the "bad" cholesterol. It may cause fatty deposits to build up inside your arteries. High-density lipoprotein (HDL) cholesterol is "good." It helps to remove the "bad" LDL cholesterol from your blood. Cholesterol is a very important risk factor for heart disease. Other risk factors are high blood pressure, smoking, stress, heredity, and weight. The heart muscle gets its supply of blood through the coronary arteries. If your LDL cholesterol is high and your HDL cholesterol is low, you are at risk for having fatty deposits build up in your coronary arteries. This leaves less room through which blood can flow. Without sufficient blood and oxygen, the heart muscle cannot function properly and you may feel chest pains (angina pectoris). When a coronary artery closes up entirely, a part of the heart muscle may die causing a heart attack (myocardial infarction). CHECKING CHOLESTEROL When your caregiver sends your blood to a lab to be examined for cholesterol, a complete lipid (fat) profile may be done. With this test, the total amount of cholesterol and levels of LDL and HDL are determined. Triglycerides are a type of fat that circulates in the blood. They can also be used to determine heart disease risk. The list below describes what the numbers should be: Test: Total Cholesterol.  Less than 200 mg/dl. Test: LDL "bad cholesterol."  Less than 100 mg/dl.  Less than 70 mg/dl if you are at very high risk of a heart attack or sudden cardiac death. Test: HDL "good  cholesterol."  Greater than 50 mg/dl for women.  Greater than 40 mg/dl for men. Test: Triglycerides.  Less than 150 mg/dl. CONTROLLING CHOLESTEROL WITH DIET Although exercise and lifestyle factors are important, your diet is key. That is because certain foods are known to raise cholesterol and others to lower it. The goal is to balance foods for their effect on cholesterol and more importantly, to replace saturated and trans fat with other types of fat, such as monounsaturated fat, polyunsaturated fat, and omega-3 fatty acids. On average, a person should consume no more than 15 to 17 g of saturated fat daily. Saturated and trans fats are considered "bad" fats, and they will raise LDL cholesterol. Saturated fats are primarily found in animal products such as meats, butter, and cream. However, that does not mean you need to give up all your favorite foods. Today, there are good tasting, low-fat, low-cholesterol substitutes for most of the things you like to eat. Choose low-fat or nonfat alternatives. Choose round or loin cuts of red meat. These types of cuts are lowest in fat and cholesterol. Chicken (without the skin), fish, veal, and ground [place] breast are great choices. Eliminate fatty meats, such as hot dogs and salami. Even shellfish have little or no saturated fat. Have a 3 oz (85 g) portion when you eat lean meat, poultry, or fish. Trans fats are also called "partially hydrogenated oils." They are oils that have been scientifically manipulated so that they are solid at room temperature resulting in a longer shelf life and improved taste and texture of foods in which they are added. Trans fats are  found in stick margarine, some tub margarines, cookies, crackers, and baked goods.  When baking and cooking, oils are a great substitute for butter. The monounsaturated oils are especially beneficial since it is believed they lower LDL and raise HDL. The oils you should avoid entirely are saturated  tropical oils, such as coconut and palm.  Remember to eat a lot from food groups that are naturally free of saturated and trans fat, including fish, fruit, vegetables, beans, grains (barley, rice, couscous, bulgur wheat), and pasta (without cream sauces).  IDENTIFYING FOODS THAT LOWER CHOLESTEROL  Soluble fiber may lower your cholesterol. This type of fiber is found in fruits such as apples, vegetables such as broccoli, potatoes, and carrots, legumes such as beans, peas, and lentils, and grains such as barley. Foods fortified with plant sterols (phytosterol) may also lower cholesterol. You should eat at least 2 g per day of these foods for a cholesterol lowering effect.  Read package labels to identify low-saturated fats, trans fat free, and low-fat foods at the supermarket. Select cheeses that have only 2 to 3 g saturated fat per ounce. Use a heart-healthy tub margarine that is free of trans fats or partially hydrogenated oil. When buying baked goods (cookies, crackers), avoid partially hydrogenated oils. Breads and muffins should be made from whole grains (whole-wheat or whole oat flour, instead of "flour" or "enriched flour"). Buy non-creamy canned soups with reduced salt and no added fats.  FOOD PREPARATION TECHNIQUES  Never deep-fry. If you must fry, either stir-fry, which uses very little fat, or use non-stick cooking sprays. When possible, broil, bake, or roast meats, and steam vegetables. Instead of putting butter or margarine on vegetables, use lemon and herbs, applesauce, and cinnamon (for squash and sweet potatoes), nonfat yogurt, salsa, and low-fat dressings for salads.  LOW-SATURATED FAT / LOW-FAT FOOD SUBSTITUTES Meats / Saturated Fat (g)  Avoid: Steak, marbled (3 oz/85 g) / 11 g  Choose: Steak, lean (3 oz/85 g) / 4 g  Avoid: Hamburger (3 oz/85 g) / 7 g  Choose: Hamburger, lean (3 oz/85 g) / 5 g  Avoid: Ham (3 oz/85 g) / 6 g  Choose: Ham, lean cut (3 oz/85 g) / 2.4 g  Avoid:  Chicken, with skin, dark meat (3 oz/85 g) / 4 g  Choose: Chicken, skin removed, dark meat (3 oz/85 g) / 2 g  Avoid: Chicken, with skin, light meat (3 oz/85 g) / 2.5 g  Choose: Chicken, skin removed, light meat (3 oz/85 g) / 1 g Dairy / Saturated Fat (g)  Avoid: Whole milk (1 cup) / 5 g  Choose: Low-fat milk, 2% (1 cup) / 3 g  Choose: Low-fat milk, 1% (1 cup) / 1.5 g  Choose: Skim milk (1 cup) / 0.3 g  Avoid: Hard cheese (1 oz/28 g) / 6 g  Choose: Skim milk cheese (1 oz/28 g) / 2 to 3 g  Avoid: Cottage cheese, 4% fat (1 cup) / 6.5 g  Choose: Low-fat cottage cheese, 1% fat (1 cup) / 1.5 g  Avoid: Ice cream (1 cup) / 9 g  Choose: Sherbet (1 cup) / 2.5 g  Choose: Nonfat frozen yogurt (1 cup) / 0.3 g  Choose: Frozen fruit bar / trace  Avoid: Whipped cream (1 tbs) / 3.5 g  Choose: Nondairy whipped topping (1 tbs) / 1 g Condiments / Saturated Fat (g)  Avoid: Mayonnaise (1 tbs) / 2 g  Choose: Low-fat mayonnaise (1 tbs) / 1 g  Avoid: Butter (1 tbs) /  7 g  Choose: Extra light margarine (1 tbs) / 1 g  Avoid: Coconut oil (1 tbs) / 11.8 g  Choose: Olive oil (1 tbs) / 1.8 g  Choose: Corn oil (1 tbs) / 1.7 g  Choose: Safflower oil (1 tbs) / 1.2 g  Choose: Sunflower oil (1 tbs) / 1.4 g  Choose: Soybean oil (1 tbs) / 2.4 g  Choose: Canola oil (1 tbs) / 1 g Document Released: 05/21/2005 Document Revised: 08/13/2011 Document Reviewed: 11/09/2010 [facility] Patient Information 2013 [place], [place].

## 2012-10-09 ENCOUNTER — Ambulatory Visit (INDEPENDENT_AMBULATORY_CARE_PROVIDER_SITE_OTHER): Payer: BC Managed Care – PPO | Admitting: Family Medicine

## 2012-10-09 ENCOUNTER — Telehealth: Payer: Self-pay | Admitting: *Deleted

## 2012-10-09 DIAGNOSIS — Z23 Encounter for immunization: Secondary | ICD-10-CM

## 2012-10-09 DIAGNOSIS — E7800 Pure hypercholesterolemia, unspecified: Secondary | ICD-10-CM

## 2012-10-09 DIAGNOSIS — Z2911 Encounter for prophylactic immunotherapy for respiratory syncytial virus (RSV): Secondary | ICD-10-CM

## 2012-10-09 MED ORDER — ATORVASTATIN CALCIUM 20 MG PO TABS: 20.0000 mg | ORAL_TABLET | Freq: Every day | ORAL | Status: DC

## 2012-10-09 NOTE — Telephone Encounter (Signed)
Pt was here for Zostavax vaccine.  She had her labs and CPE recently and was informed her cholesterol was elevated.  She has decided she wants to take a med to bring her cholesterol down.  Please send to her local pharmacy for now.  Is she tolerates it well, send to her mail order.  Please advise.

## 2012-10-09 NOTE — Telephone Encounter (Signed)
Pt informed of RX and future labs

## 2012-10-09 NOTE — Telephone Encounter (Signed)
lipitor 20 mg once daily and repeat lipids and hepatic in 6-8 weeks.

## 2012-11-19 ENCOUNTER — Other Ambulatory Visit (INDEPENDENT_AMBULATORY_CARE_PROVIDER_SITE_OTHER): Payer: BC Managed Care – PPO

## 2012-11-19 DIAGNOSIS — E7800 Pure hypercholesterolemia, unspecified: Secondary | ICD-10-CM

## 2012-11-19 LAB — HEPATIC FUNCTION PANEL: Total Bilirubin: 1.2 mg/dL (ref 0.3–1.2)

## 2012-11-19 LAB — LIPID PANEL
HDL: 53.6 mg/dL (ref >39.00)
LDL Cholesterol: 82 mg/dL (ref 0–99)
Total CHOL/HDL Ratio: 3
VLDL: 16.8 mg/dL (ref 0.0–40.0)

## 2012-11-19 NOTE — Patient Instructions (Signed)
, °

## 2012-11-20 ENCOUNTER — Other Ambulatory Visit: Payer: Self-pay | Admitting: *Deleted

## 2012-11-20 ENCOUNTER — Other Ambulatory Visit: Payer: BC Managed Care – PPO

## 2012-11-20 MED ORDER — ATORVASTATIN CALCIUM 20 MG PO TABS: 20.0000 mg | ORAL_TABLET | Freq: Every day | ORAL | Status: DC

## 2012-11-20 NOTE — Progress Notes (Signed)
Quick Note:  Pt informed. She requested 30 day supply go to local pharmacy and [facility] day [facility]. ______

## 2013-01-08 ENCOUNTER — Ambulatory Visit (INDEPENDENT_AMBULATORY_CARE_PROVIDER_SITE_OTHER): Payer: BC Managed Care – PPO | Admitting: Family Medicine

## 2013-01-08 ENCOUNTER — Encounter: Payer: Self-pay | Admitting: Family Medicine

## 2013-01-08 VITALS — BP 124/64 | HR 80 | Temp 98.9°F | Wt 130.0 lb

## 2013-01-08 DIAGNOSIS — S91109A Unspecified open wound of unspecified toe(s) without damage to nail, initial encounter: Secondary | ICD-10-CM

## 2013-01-08 DIAGNOSIS — S91209A Unspecified open wound of unspecified toe(s) with damage to nail, initial encounter: Secondary | ICD-10-CM

## 2013-01-08 NOTE — Progress Notes (Signed)
  Subjective:    Patient ID: Heather Adams, female    DOB: [birth year], 62 y.o.   MRN: [id number]  HPI Patient here with loosened left great toenail She had trauma last October. She is growing new nail underneath and top nail is loose. No associated pain. She frequently catches on socks No redness. No drainage. No bleeding.  Past Medical History  Diagnosis Date  . Osteoarthritis of hip     right  . Fainting spell     with blood draws   . Hyperlipidemia    Past Surgical History  Procedure Laterality Date  . Tonsillectomy  1977  . Hisogram  1986  . Endometrosis  1986  . Total hip arthroplasty  2010,2011    reports that she has never smoked. She does not have any smokeless tobacco history on file. Her alcohol and drug histories are not on file. family history includes Hearing loss in her paternal aunt, paternal grandfather, and paternal uncle; Heart disease (age of onset: 52) in her paternal grandfather; and Hyperlipidemia in her mother. Allergies  Allergen Reactions  . Thimerosal       Review of Systems  Musculoskeletal: Negative for gait problem.       Objective:   Physical Exam  Constitutional: She appears well-developed and well-nourished.  Cardiovascular: Normal rate and regular rhythm.   Skin:  Left great toenail reveals that she has new nail underneath lifting up and loosening old nail. With surgical scissors trimmed this away without difficulty. Minimal residuainfl nail along the lateral border-not ingrown and not infected.          Assessment & Plan:  Detached toenail.  Detached portion of nail excised with surgical scissors.  Pt tolerated well.

## 2013-05-06 ENCOUNTER — Ambulatory Visit (INDEPENDENT_AMBULATORY_CARE_PROVIDER_SITE_OTHER): Payer: BC Managed Care – PPO | Admitting: Family Medicine

## 2013-05-06 ENCOUNTER — Encounter: Payer: Self-pay | Admitting: Family Medicine

## 2013-05-06 VITALS — BP 120/80 | HR 95 | Temp 99.2°F | Wt 132.0 lb

## 2013-05-06 DIAGNOSIS — K649 Unspecified hemorrhoids: Secondary | ICD-10-CM

## 2013-05-06 MED ORDER — HYDROCORTISONE ACETATE 25 MG RE SUPP: 25.0000 mg | Freq: Two times a day (BID) | RECTAL | Status: DC

## 2013-05-06 NOTE — Progress Notes (Signed)
Pre visit review using our clinic review tool, if applicable. No additional management support is needed unless otherwise documented below in the visit note. 

## 2013-05-06 NOTE — Progress Notes (Signed)
   Subjective:    Patient ID: Heather Adams, female    DOB: May 06, 1951, 62 y.o.   MRN: [id number]  HPI Here for intermittent rectal bleeding over the past 2 months. She developed hemorrhoids when she was pregnant and they have bothered her off and on since then. Lately she gets some mild anal irritation but no real pain. Her BMs are soft and she uses Miralax. She often has some blood on the toilet paper when wiping. No abdominal pain.    Review of Systems  Constitutional: Negative.   Gastrointestinal: Positive for anal bleeding. Negative for nausea, vomiting, abdominal pain, diarrhea, constipation, blood in stool, abdominal distention and rectal pain.       Objective:   Physical Exam  Constitutional: She appears well-developed and well-nourished. No distress.  Abdominal: Soft. Bowel sounds are normal. She exhibits no distension and no mass. There is no tenderness. There is no rebound and no guarding.  Genitourinary:  Several external hemorrhoids, no active bleeding          Assessment & Plan:  Try Anusol HC suppositories

## 2013-07-29 ENCOUNTER — Encounter: Payer: Self-pay | Admitting: Family Medicine

## 2013-07-29 ENCOUNTER — Ambulatory Visit (INDEPENDENT_AMBULATORY_CARE_PROVIDER_SITE_OTHER): Payer: BC Managed Care – PPO | Admitting: Family Medicine

## 2013-07-29 VITALS — BP 120/70 | HR 66 | Temp 97.4°F | Wt 139.0 lb

## 2013-07-29 DIAGNOSIS — M722 Plantar fascial fibromatosis: Secondary | ICD-10-CM

## 2013-07-29 NOTE — Progress Notes (Signed)
   Subjective:    Patient ID: Heather Adams, female    DOB: November 26, 1950, 63 y.o.   MRN: [id number]  Foot Pain Pertinent negatives include no rash or weakness.   Patient symptoms left foot pain. She noticed some thickening along the plantar fascial band recently. Pain is really very minimal. She has very high arches. She got some inserts recently and started wearing his a few days ago. Has not been a consistent stretching. Her pain has been extremely minimal in she's not taking any anti-inflammatories.  Past Medical History  Diagnosis Date  . Osteoarthritis of hip     right  . Fainting spell     with blood draws   . Hyperlipidemia    Past Surgical History  Procedure Laterality Date  . Tonsillectomy  1977  . Hisogram  1986  . Endometrosis  1986  . Total hip arthroplasty  2010,2011  . Colonoscopy  01-17-12    per Dr. Darlen Round in [place], hemorrhoids, no polyps, repeat in 5 yrs     reports that she has never smoked. She has never used smokeless tobacco. She reports that she drinks alcohol. She reports that she does not use illicit drugs. family history includes Hearing loss in her paternal aunt, paternal grandfather, and paternal uncle; Heart disease (age of onset: 4) in her paternal grandfather; Hyperlipidemia in her mother. Allergies  Allergen Reactions  . Thimerosal       Review of Systems  Skin: Negative for rash.  Neurological: Negative for weakness.       Objective:   Physical Exam  Constitutional: She appears well-developed and well-nourished.  Cardiovascular: Normal rate.   Pulmonary/Chest: Effort normal and breath sounds normal. No respiratory distress. She has no wheezes. She has no rales.  Musculoskeletal:  Left foot reveals no edema. Good distal pulses. She has some thickening along the mid aspect of the left plantar fascia. She does not have any tenderness over the calcaneal region or anywhere else in the foot. Full range of motion left ankle.           Assessment & Plan:  Left plantar fibromatosis. She is reassured this is benign. We've recommended stretches. She currently has minimal pain. We've recommended inserts for better arch support. We've offered sports medicine referral for consideration of orthotics if symptoms persist

## 2013-07-29 NOTE — Progress Notes (Signed)
Pre visit review using our clinic review tool, if applicable. No additional management support is needed unless otherwise documented below in the visit note. 

## 2013-07-29 NOTE — Patient Instructions (Signed)
Focus on achilles/calf/foot stretches as discussed Consider inserts for arch support If symptoms persist we could consider sports medicine referral.   You have Plantar fibromatosis which is benign.

## 2013-10-30 ENCOUNTER — Other Ambulatory Visit: Payer: Self-pay | Admitting: Family Medicine

## 2013-11-05 ENCOUNTER — Other Ambulatory Visit: Payer: BC Managed Care – PPO

## 2013-11-09 ENCOUNTER — Other Ambulatory Visit (INDEPENDENT_AMBULATORY_CARE_PROVIDER_SITE_OTHER): Payer: BC Managed Care – PPO

## 2013-11-09 DIAGNOSIS — Z0000 Encounter for general adult medical examination without abnormal findings: Secondary | ICD-10-CM

## 2013-11-09 LAB — BASIC METABOLIC PANEL
BUN: 16 mg/dL (ref 6–23)
CALCIUM: 9.3 mg/dL (ref 8.4–10.5)
CO2: 28 mEq/L (ref 19–32)
CREATININE: 0.9 mg/dL (ref 0.4–1.2)
Chloride: 108 mEq/L (ref 96–112)
GFR: 68.46 mL/min (ref >60.00)
Glucose, Bld: 88 mg/dL (ref 70–99)
Potassium: 4.6 mEq/L (ref 3.5–5.1)
Sodium: 140 mEq/L (ref 135–145)

## 2013-11-09 LAB — LIPID PANEL
Cholesterol: 157 mg/dL (ref 0–200)
HDL: 52.8 mg/dL (ref >39.00)
LDL Cholesterol: 92 mg/dL (ref 0–99)
NonHDL: 104.2
Total CHOL/HDL Ratio: 3
Triglycerides: 59 mg/dL (ref 0.0–149.0)
VLDL: 11.8 mg/dL (ref 0.0–40.0)

## 2013-11-09 LAB — CBC WITH DIFFERENTIAL/PLATELET
Basophils Absolute: 0 10*3/uL (ref 0.0–0.1)
Basophils Relative: 0.9 % (ref 0.0–3.0)
Eosinophils Absolute: 0.1 10*3/uL (ref 0.0–0.7)
Eosinophils Relative: 2.9 % (ref 0.0–5.0)
HEMATOCRIT: 38.7 % (ref 36.0–46.0)
Hemoglobin: 12.9 g/dL (ref 12.0–15.0)
LYMPHS ABS: 1.6 10*3/uL (ref 0.7–4.0)
LYMPHS PCT: 34.9 % (ref 12.0–46.0)
MCHC: 33.3 g/dL (ref 30.0–36.0)
MCV: 90.4 fl (ref 78.0–100.0)
Monocytes Absolute: 0.3 10*3/uL (ref 0.1–1.0)
Monocytes Relative: 7.5 % (ref 3.0–12.0)
Neutro Abs: 2.4 10*3/uL (ref 1.4–7.7)
Neutrophils Relative %: 53.8 % (ref 43.0–77.0)
Platelets: 232 10*3/uL (ref 150.0–400.0)
RBC: 4.29 Mil/uL (ref 3.87–5.11)
RDW: 13.4 % (ref 11.5–15.5)
WBC: 4.4 10*3/uL (ref 4.0–10.5)

## 2013-11-09 LAB — POCT URINALYSIS DIPSTICK
BILIRUBIN UA: NEGATIVE
Blood, UA: NEGATIVE
Glucose, UA: NEGATIVE
Ketones, UA: NEGATIVE
Leukocytes, UA: NEGATIVE
Nitrite, UA: NEGATIVE
PH UA: 5.5
Protein, UA: NEGATIVE
Spec Grav, UA: 1.015
Urobilinogen, UA: 0.2

## 2013-11-09 LAB — HEPATIC FUNCTION PANEL
ALK PHOS: 48 U/L (ref 39–117)
ALT: 24 U/L (ref 0–35)
AST: 24 U/L (ref 0–37)
Albumin: 4.1 g/dL (ref 3.5–5.2)
BILIRUBIN DIRECT: 0.1 mg/dL (ref 0.0–0.3)
TOTAL PROTEIN: 6.7 g/dL (ref 6.0–8.3)
Total Bilirubin: 0.8 mg/dL (ref 0.2–1.2)

## 2013-11-09 LAB — TSH: TSH: 2.07 u[IU]/mL (ref 0.35–4.50)

## 2013-11-12 ENCOUNTER — Encounter: Payer: Self-pay | Admitting: Family Medicine

## 2013-11-12 ENCOUNTER — Ambulatory Visit (INDEPENDENT_AMBULATORY_CARE_PROVIDER_SITE_OTHER): Payer: BC Managed Care – PPO | Admitting: Family Medicine

## 2013-11-12 VITALS — BP 130/76 | HR 62 | Temp 98.6°F | Ht 61.75 in | Wt 137.0 lb

## 2013-11-12 DIAGNOSIS — Z0000 Encounter for general adult medical examination without abnormal findings: Secondary | ICD-10-CM

## 2013-11-12 MED ORDER — HYDROCORTISONE ACETATE 25 MG RE SUPP: 25.0000 mg | Freq: Two times a day (BID) | RECTAL | Status: DC

## 2013-11-12 NOTE — Progress Notes (Signed)
Pre visit review using our clinic review tool, if applicable. No additional management support is needed unless otherwise documented below in the visit note. 

## 2013-11-12 NOTE — Progress Notes (Signed)
Subjective:    Patient ID: Heather Adams, female    DOB: 10-03-1950, 63 y.o.   MRN: [id number]  HPI Patient here for complete physical. She has history of osteoarthritis with prior bilateral hip replacements, GERD, and hyperlipidemia. She's doing very well. Stays quite active. No recent head pains. GERD symptoms are well controlled.  takes atorvastatin for hyperlipidemia. He's had some mild weight gain this past year.  Nonsmoker  Colonoscopy up to date. Immunizations are up-to-date. She had shingles vaccine earlier this year.  She has had some issues with inflamed external hemorrhoids. Has used suppository previously. No recent rectal bleeding. Occasional constipation. No associated pain.  Past Medical History  Diagnosis Date  . Osteoarthritis of hip     right  . Fainting spell     with blood draws   . Hyperlipidemia    Past Surgical History  Procedure Laterality Date  . Tonsillectomy  1977  . Hisogram  1986  . Endometrosis  1986  . Total hip arthroplasty  2010,2011  . Colonoscopy  01-17-12    per Dr. Darlen Round in [place], hemorrhoids, no polyps, repeat in 5 yrs     reports that she has never smoked. She has never used smokeless tobacco. She reports that she drinks alcohol. She reports that she does not use illicit drugs. family history includes Hearing loss in her paternal aunt, paternal grandfather, and paternal uncle; Heart disease (age of onset: 21) in her paternal grandfather; Hyperlipidemia in her mother. Allergies  Allergen Reactions  . Thimerosal       Review of Systems  Constitutional: Negative for fever, activity change, appetite change, fatigue and unexpected weight change.  HENT: Negative for ear pain, hearing loss, sore throat and trouble swallowing.   Eyes: Negative for visual disturbance.  Respiratory: Negative for cough and shortness of breath.   Cardiovascular: Negative for chest pain and palpitations.  Gastrointestinal: Negative for abdominal pain,  diarrhea, constipation and blood in stool.  Genitourinary: Negative for dysuria and hematuria.  Musculoskeletal: Negative for arthralgias, back pain and myalgias.  Skin: Negative for rash.  Neurological: Negative for dizziness, syncope and headaches.  Hematological: Negative for adenopathy.  Psychiatric/Behavioral: Negative for confusion and dysphoric mood.       Objective:   Physical Exam  Constitutional: She is oriented to person, place, and time. She appears well-developed and well-nourished.  HENT:  Head: Normocephalic and atraumatic.  Eyes: EOM are normal. Pupils are equal, round, and reactive to light.  Neck: Normal range of motion. Neck supple. No thyromegaly present.  Cardiovascular: Normal rate, regular rhythm and normal heart sounds.   No murmur heard. Pulmonary/Chest: Breath sounds normal. No respiratory distress. She has no wheezes. She has no rales.  Abdominal: Soft. Bowel sounds are normal. She exhibits no distension and no mass. There is no tenderness. There is no rebound and no guarding.  Musculoskeletal: Normal range of motion. She exhibits no edema.  Lymphadenopathy:    She has no cervical adenopathy.  Neurological: She is alert and oriented to person, place, and time. She displays normal reflexes. No cranial nerve deficit.  Skin: No rash noted.  Psychiatric: She has a normal mood and affect. Her behavior is normal. Judgment and thought content normal.          Assessment & Plan:  Health maintenance. She will continue seeing her GYN.  Labs reviewed with no major concerns. We refilled her hydrocortisone 25 mg suppositories to use as needed for hemorrhoids. She'll continue with yearly mammograms and  recommended yearly flu vaccine

## 2014-04-02 ENCOUNTER — Ambulatory Visit (INDEPENDENT_AMBULATORY_CARE_PROVIDER_SITE_OTHER): Payer: BC Managed Care – PPO

## 2014-04-02 DIAGNOSIS — Z23 Encounter for immunization: Secondary | ICD-10-CM

## 2014-08-26 ENCOUNTER — Other Ambulatory Visit: Payer: Self-pay | Admitting: Family Medicine

## 2014-11-04 ENCOUNTER — Telehealth: Payer: Self-pay | Admitting: *Deleted

## 2014-11-04 NOTE — Telephone Encounter (Signed)
Left message on voicemail to call office. Called to see if had Mammogram.

## 2014-11-10 ENCOUNTER — Other Ambulatory Visit: Payer: Self-pay | Admitting: Family Medicine

## 2014-11-22 ENCOUNTER — Other Ambulatory Visit (INDEPENDENT_AMBULATORY_CARE_PROVIDER_SITE_OTHER): Payer: BLUE CROSS/BLUE SHIELD

## 2014-11-22 DIAGNOSIS — Z0000 Encounter for general adult medical examination without abnormal findings: Secondary | ICD-10-CM

## 2014-11-22 LAB — CBC WITH DIFFERENTIAL/PLATELET
BASOS ABS: 0 10*3/uL (ref 0.0–0.1)
Basophils Relative: 1 % (ref 0.0–3.0)
Eosinophils Absolute: 0.1 10*3/uL (ref 0.0–0.7)
Eosinophils Relative: 2.3 % (ref 0.0–5.0)
HEMATOCRIT: 39.1 % (ref 36.0–46.0)
Hemoglobin: 13.2 g/dL (ref 12.0–15.0)
LYMPHS ABS: 1.7 10*3/uL (ref 0.7–4.0)
LYMPHS PCT: 37.1 % (ref 12.0–46.0)
MCHC: 33.8 g/dL (ref 30.0–36.0)
MCV: 90.2 fl (ref 78.0–100.0)
MONOS PCT: 7 % (ref 3.0–12.0)
Monocytes Absolute: 0.3 10*3/uL (ref 0.1–1.0)
NEUTROS PCT: 52.6 % (ref 43.0–77.0)
Neutro Abs: 2.4 10*3/uL (ref 1.4–7.7)
Platelets: 228 10*3/uL (ref 150.0–400.0)
RBC: 4.33 Mil/uL (ref 3.87–5.11)
RDW: 12.8 % (ref 11.5–15.5)
WBC: 4.6 10*3/uL (ref 4.0–10.5)

## 2014-11-22 LAB — COMPREHENSIVE METABOLIC PANEL
ALBUMIN: 4.2 g/dL (ref 3.5–5.2)
ALT: 18 U/L (ref 0–35)
AST: 20 U/L (ref 0–37)
Alkaline Phosphatase: 54 U/L (ref 39–117)
BUN: 14 mg/dL (ref 6–23)
CALCIUM: 9.2 mg/dL (ref 8.4–10.5)
CHLORIDE: 104 meq/L (ref 96–112)
CO2: 26 meq/L (ref 19–32)
Creatinine, Ser: 0.95 mg/dL (ref 0.40–1.20)
GFR: 63.28 mL/min (ref >60.00)
Glucose, Bld: 87 mg/dL (ref 70–99)
POTASSIUM: 4.4 meq/L (ref 3.5–5.1)
Sodium: 136 mEq/L (ref 135–145)
Total Bilirubin: 0.7 mg/dL (ref 0.2–1.2)
Total Protein: 6.6 g/dL (ref 6.0–8.3)

## 2014-11-22 LAB — LIPID PANEL
CHOL/HDL RATIO: 3
CHOLESTEROL: 158 mg/dL (ref 0–200)
HDL: 47.1 mg/dL (ref >39.00)
LDL Cholesterol: 94 mg/dL (ref 0–99)
NonHDL: 110.9
Triglycerides: 83 mg/dL (ref 0.0–149.0)
VLDL: 16.6 mg/dL (ref 0.0–40.0)

## 2014-11-22 LAB — TSH: TSH: 1.63 u[IU]/mL (ref 0.35–4.50)

## 2014-11-26 ENCOUNTER — Encounter: Payer: Self-pay | Admitting: Family Medicine

## 2014-11-26 ENCOUNTER — Ambulatory Visit (INDEPENDENT_AMBULATORY_CARE_PROVIDER_SITE_OTHER): Payer: BLUE CROSS/BLUE SHIELD | Admitting: Family Medicine

## 2014-11-26 VITALS — BP 120/80 | HR 68 | Temp 98.0°F | Ht 61.0 in | Wt 140.0 lb

## 2014-11-26 DIAGNOSIS — Z0000 Encounter for general adult medical examination without abnormal findings: Secondary | ICD-10-CM | POA: Diagnosis not present

## 2014-11-26 MED ORDER — HYDROCORTISONE ACETATE 25 MG RE SUPP: 25.0000 mg | Freq: Two times a day (BID) | RECTAL | Status: DC

## 2014-11-26 NOTE — Progress Notes (Signed)
Pre visit review using our clinic review tool, if applicable. No additional management support is needed unless otherwise documented below in the visit note. 

## 2014-11-26 NOTE — Progress Notes (Signed)
   Subjective:    Patient ID: Heather Adams, female    DOB: Jan 29, 1951, 64 y.o.   MRN: [id number]  HPI Seen for physical exam. She sees gynecologist regularly. Immunizations are up-to-date. She's had recent mammogram and Pap smear last August which were normal. Colonoscopy up-to-date. Exercises regularly. 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 Takes atorvastatin for hyperlipidemia. No cardiac history.  Past Medical History  Diagnosis Date  . Osteoarthritis of hip     right  . Fainting spell     with blood draws   . Hyperlipidemia    Past Surgical History  Procedure Laterality Date  . Tonsillectomy  1977  . Hisogram  1986  . Endometrosis  1986  . Total hip arthroplasty  2010,2011  . Colonoscopy  01-17-12    per Dr. Darlen Round in [place], hemorrhoids, no polyps, repeat in 5 yrs     reports that she has never smoked. She has never used smokeless tobacco. She reports that she drinks alcohol. She reports that she does not use illicit drugs. family history includes Hearing loss in her paternal aunt, paternal grandfather, and paternal uncle; Heart disease (age of onset: 81) in her paternal grandfather; Hyperlipidemia in her mother. Allergies  Allergen Reactions  . Thimerosal       Review of Systems  Constitutional: Negative for fever, activity change, appetite change, fatigue and unexpected weight change.  HENT: Negative for ear pain, hearing loss, sore throat and trouble swallowing.   Eyes: Negative for visual disturbance.  Respiratory: Negative for cough and shortness of breath.   Cardiovascular: Negative for chest pain and palpitations.  Gastrointestinal: Negative for abdominal pain, diarrhea, constipation and blood in stool.  Genitourinary: Negative for dysuria and hematuria.  Musculoskeletal: Negative for myalgias, back pain and arthralgias.  Skin: Negative for rash.  Neurological: Negative for dizziness, syncope and headaches.  Hematological:  Negative for adenopathy.  Psychiatric/Behavioral: Negative for confusion and dysphoric mood.       Objective:   Physical Exam  Constitutional: She is oriented to person, place, and time. She appears well-developed and well-nourished.  HENT:  Head: Normocephalic and atraumatic.  Eyes: EOM are normal. Pupils are equal, round, and reactive to light.  Neck: Normal range of motion. Neck supple. No thyromegaly present.  Cardiovascular: Normal rate, regular rhythm and normal heart sounds.   No murmur heard. Pulmonary/Chest: Breath sounds normal. No respiratory distress. She has no wheezes. She has no rales.  Abdominal: Soft. Bowel sounds are normal. She exhibits no distension and no mass. There is no tenderness. There is no rebound and no guarding.  Musculoskeletal: Normal range of motion. She exhibits no edema.  Lymphadenopathy:    She has no cervical adenopathy.  Neurological: She is alert and oriented to person, place, and time. She displays normal reflexes. No cranial nerve deficit.  Skin: No rash noted.  Psychiatric: She has a normal mood and affect. Her behavior is normal. Judgment and thought content normal.          Assessment & Plan:  Complete physical. Health maintenance issues addressed. She is up-to-date with all. Continue yearly flu vaccine. Labs reviewed. No major concerns.

## 2015-02-27 ENCOUNTER — Other Ambulatory Visit: Payer: Self-pay | Admitting: Family Medicine

## 2015-03-04 LAB — HM PAP SMEAR: HM Pap smear: NORMAL

## 2015-03-28 ENCOUNTER — Telehealth: Payer: Self-pay | Admitting: Family Medicine

## 2015-03-28 NOTE — Telephone Encounter (Signed)
Agree with advice per RN.  Office follow up for any recurrent dizziness.

## 2015-03-28 NOTE — Telephone Encounter (Signed)
[facility] - Client [facility]  Patient Name: Heather Adams  DOB: 22-Aug-1950    Initial Comment Caller states she had an episode where she was dizzy on 03/14/15, this happened one time before. She is having a hemorrhoids surgery next week and is not sure if she needs to be seen before the procedure.    Nurse Assessment  Nurse: Genoveva Ill, RN, Lattie Haw Date/Time (Eastern Time): 03/28/2015 2:51:06 PM  Confirm and document reason for call. If symptomatic, describe symptoms. ---Caller states she had an episode where she was dizzy on 03/14/15, this happened one time before. She is having a hemorrhoids surgery next week and is not sure if she needs to be seen before the procedure; the previous time this happened, she drank water and was fine and then on 03/14/15, it happened again and she did the same and was fine after drinking more water; thinks she didn't drink enough the day before and had been outside in the heat; no dizziness since and is not diabetic; only takes med for high cholesterol  Has the patient traveled out of the country within the last 30 days? ---Not Applicable  Does the patient have any new or worsening symptoms? ---No  Please document clinical information provided and list any resource used. ---advised that dehydration does cause dizziness/lightheadedness and since it resolved after drinking fluids, most likely it was from being a little dehydrated; per RN knowledge; advised can discuss with surgeon to see if he wants her seen before the surgery, but to call back if it returns or has any other sx of concern; understanding verb     Guidelines    Guideline Title Affirmed Question Affirmed Notes       Final Disposition User   Clinical Call Burress, RN, Lattie Haw

## 2015-03-29 NOTE — Telephone Encounter (Signed)
Been trying to contact the pt but theres no answer

## 2015-04-04 HISTORY — PX: OTHER SURGICAL HISTORY: SHX169

## 2015-06-06 ENCOUNTER — Other Ambulatory Visit: Payer: Self-pay | Admitting: Family Medicine

## 2015-09-06 ENCOUNTER — Other Ambulatory Visit: Payer: Self-pay | Admitting: Family Medicine

## 2015-11-07 ENCOUNTER — Ambulatory Visit (INDEPENDENT_AMBULATORY_CARE_PROVIDER_SITE_OTHER): Payer: BLUE CROSS/BLUE SHIELD | Admitting: Family Medicine

## 2015-11-07 ENCOUNTER — Encounter: Payer: Self-pay | Admitting: Family Medicine

## 2015-11-07 VITALS — BP 130/90 | HR 78 | Temp 98.3°F | Ht 61.0 in | Wt 148.0 lb

## 2015-11-07 DIAGNOSIS — R42 Dizziness and giddiness: Secondary | ICD-10-CM | POA: Diagnosis not present

## 2015-11-07 DIAGNOSIS — K219 Gastro-esophageal reflux disease without esophagitis: Secondary | ICD-10-CM | POA: Diagnosis not present

## 2015-11-07 NOTE — Progress Notes (Signed)
Subjective:    Patient ID: Heather Adams, female    DOB: 1950-06-13, 65 y.o.   MRN: [id number]  HPI Patient seen for the following issues  Episodic dizziness. On further description this sounds more like vertigo. She had couple episodes recently which occurred early morning and usually only lasts seconds and then resolves. Sensation of "spinning" triggered by head movement. Most recent episode was Saturday night when she got up to go the bathroom. She's not had any associated hearing loss or tinnitus No focal weakness. No slurred speech. No headaches. She has noted that she's had similar symptoms in the past which seem to consistently occur in the spring and fall which led her to think this may be allergy related. She has had some nasal congestion recently but has not been taking any antihistamine or Flonase regularly. No syncope or presyncopal symptoms.  Second issue is GERD. Controlled on the past on Protonix. Symptoms are very intermittent. No dysphagia. No appetite or weight changes. Has not tried any over-the-counter antacids recently. In the past has had sometimes difficulty controlling with Zantac alone.  Past Medical History  Diagnosis Date  . Osteoarthritis of hip     right  . Fainting spell     with blood draws   . Hyperlipidemia    Past Surgical History  Procedure Laterality Date  . Tonsillectomy  1977  . Hisogram  1986  . Endometrosis  1986  . Total hip arthroplasty  2010,2011  . Colonoscopy  01-17-12    per Dr. Darlen Round in [place], hemorrhoids, no polyps, repeat in 5 yrs   . Hemmorriod sugery  04-04-2015    reports that she has never smoked. She has never used smokeless tobacco. She reports that she drinks alcohol. She reports that she does not use illicit drugs. family history includes Hearing loss in her paternal aunt, paternal grandfather, and paternal uncle; Heart disease (age of onset: 90) in her paternal grandfather; Hyperlipidemia in her  mother. Allergies  Allergen Reactions  . Thimerosal       Review of Systems  Constitutional: Negative for fever, chills, appetite change and unexpected weight change.  HENT: Negative for trouble swallowing.   Eyes: Negative for visual disturbance.  Respiratory: Negative for shortness of breath.   Cardiovascular: Negative for chest pain.  Gastrointestinal: Negative for nausea, vomiting, abdominal pain and blood in stool.  Genitourinary: Negative for dysuria.  Neurological: Positive for dizziness. Negative for seizures, syncope and weakness.  Hematological: Negative for adenopathy.  Psychiatric/Behavioral: Negative for confusion.       Objective:   Physical Exam  Constitutional: She is oriented to person, place, and time. She appears well-developed and well-nourished. No distress.  HENT:  Right Ear: External ear normal.  Left Ear: External ear normal.  Mouth/Throat: Oropharynx is clear and moist.  Eyes: Pupils are equal, round, and reactive to light.  Neck: Neck supple. No thyromegaly present.  Cardiovascular: Normal rate and regular rhythm.   Pulmonary/Chest: Effort normal and breath sounds normal. No respiratory distress. She has no wheezes. She has no rales.  Neurological: She is alert and oriented to person, place, and time. She has normal reflexes. No cranial nerve deficit. Coordination normal.          Assessment & Plan:  #1 vertigo. Symptoms are very transient and intermittent. Suspect benign vertigo. Currently symptom-free. We gave her red flags to look for for more worrisome vertigo. We discussed taking regular Flonase and getting back on regular Zyrtec to control allergy symptoms.  #  2 GERD. She is not any red flags such as appetite or weight changes Discussed lifestyle modification. Consider trial of over-the-counter Nexium or Prilosec if symptoms not controlled with H2 blockers  Eulas Post MD [facility] at [facility]

## 2015-11-07 NOTE — Patient Instructions (Addendum)
     Vertigo Vertigo means you feel like you or your surroundings are moving when they are not. Vertigo can be dangerous if it occurs when you are at work, driving, or performing difficult activities.  CAUSES  Vertigo occurs when there is a conflict of signals sent to your brain from the visual and sensory systems in your body. There are many different causes of vertigo, including:  Infections, especially in the inner ear.  A bad reaction to a drug or misuse of alcohol and medicines.  Withdrawal from drugs or alcohol.  Rapidly changing positions, such as lying down or rolling over in bed.  A migraine headache.  Decreased blood flow to the brain.  Increased pressure in the brain from a head injury, infection, tumor, or bleeding. SYMPTOMS  You may feel as though the world is spinning around or you are falling to the ground. Because your balance is upset, vertigo can cause nausea and vomiting. You may have involuntary eye movements (nystagmus). DIAGNOSIS  Vertigo is usually diagnosed by physical exam. If the cause of your vertigo is unknown, your caregiver may perform imaging tests, such as an MRI scan (magnetic resonance imaging). TREATMENT  Most cases of vertigo resolve on their own, without treatment. Depending on the cause, your caregiver may prescribe certain medicines. If your vertigo is related to body position issues, your caregiver may recommend movements or procedures to correct the problem. In rare cases, if your vertigo is caused by certain inner ear problems, you may need surgery. HOME CARE INSTRUCTIONS   Follow your caregiver's instructions.  Avoid driving.  Avoid operating heavy machinery.  Avoid performing any tasks that would be dangerous to you or others during a vertigo episode.  Tell your caregiver if you notice that certain medicines seem to be causing your vertigo. Some of the medicines used to treat vertigo episodes can actually make them worse in some  people. SEEK IMMEDIATE MEDICAL CARE IF:   Your medicines do not relieve your vertigo or are making it worse.  You develop problems with talking, walking, weakness, or using your arms, hands, or legs.  You develop severe headaches.  Your nausea or vomiting continues or gets worse.  You develop visual changes.  A family member notices behavioral changes.  Your condition gets worse. MAKE SURE YOU:  Understand these instructions.  Will watch your condition.  Will get help right away if you are not doing well or get worse.   This information is not intended to replace advice given to you by your health care provider. Make sure you discuss any questions you have with your health care provider.   Document Released: 02/28/2005 Document Revised: 03/1.  06/2011 Document Reviewed: 09/13/2014 Elsevier Interactive Patient Education 2016 [place] back on daily Zyrtec or Xyzal and Flonase Consider OTC Nexium 20 to 40 mg daily as needed for reflux symptoms.

## 2015-11-07 NOTE — Progress Notes (Signed)
Pre visit review using our clinic review tool, if applicable. No additional management support is needed unless otherwise documented below in the visit note. 

## 2015-12-02 ENCOUNTER — Other Ambulatory Visit (INDEPENDENT_AMBULATORY_CARE_PROVIDER_SITE_OTHER): Payer: BLUE CROSS/BLUE SHIELD

## 2015-12-02 DIAGNOSIS — R7989 Other specified abnormal findings of blood chemistry: Secondary | ICD-10-CM | POA: Diagnosis not present

## 2015-12-02 DIAGNOSIS — Z0000 Encounter for general adult medical examination without abnormal findings: Secondary | ICD-10-CM

## 2015-12-02 LAB — HEPATIC FUNCTION PANEL
ALBUMIN: 4.6 g/dL (ref 3.5–5.2)
ALT: 20 U/L (ref 0–35)
AST: 20 U/L (ref 0–37)
Alkaline Phosphatase: 65 U/L (ref 39–117)
Bilirubin, Direct: 0.1 mg/dL (ref 0.0–0.3)
Total Bilirubin: 1.1 mg/dL (ref 0.2–1.2)
Total Protein: 7.4 g/dL (ref 6.0–8.3)

## 2015-12-02 LAB — CBC WITH DIFFERENTIAL/PLATELET
BASOS PCT: 1.3 % (ref 0.0–3.0)
Basophils Absolute: 0.1 10*3/uL (ref 0.0–0.1)
EOS PCT: 3 % (ref 0.0–5.0)
Eosinophils Absolute: 0.1 10*3/uL (ref 0.0–0.7)
HCT: 40.9 % (ref 36.0–46.0)
HEMOGLOBIN: 13.8 g/dL (ref 12.0–15.0)
LYMPHS ABS: 1.8 10*3/uL (ref 0.7–4.0)
Lymphocytes Relative: 37.4 % (ref 12.0–46.0)
MCHC: 33.7 g/dL (ref 30.0–36.0)
MCV: 88.3 fl (ref 78.0–100.0)
MONOS PCT: 8.9 % (ref 3.0–12.0)
Monocytes Absolute: 0.4 10*3/uL (ref 0.1–1.0)
NEUTROS PCT: 49.4 % (ref 43.0–77.0)
Neutro Abs: 2.4 10*3/uL (ref 1.4–7.7)
Platelets: 243 10*3/uL (ref 150.0–400.0)
RBC: 4.63 Mil/uL (ref 3.87–5.11)
RDW: 13.3 % (ref 11.5–15.5)
WBC: 4.9 10*3/uL (ref 4.0–10.5)

## 2015-12-02 LAB — BASIC METABOLIC PANEL
BUN: 15 mg/dL (ref 6–23)
CHLORIDE: 103 meq/L (ref 96–112)
CO2: 30 mEq/L (ref 19–32)
Calcium: 9.9 mg/dL (ref 8.4–10.5)
Creatinine, Ser: 0.97 mg/dL (ref 0.40–1.20)
GFR: 61.57 mL/min (ref >60.00)
GLUCOSE: 88 mg/dL (ref 70–99)
POTASSIUM: 4.5 meq/L (ref 3.5–5.1)
SODIUM: 138 meq/L (ref 135–145)

## 2015-12-02 LAB — LIPID PANEL
CHOLESTEROL: 191 mg/dL (ref 0–200)
HDL: 50 mg/dL (ref >39.00)
NonHDL: 141.42
TRIGLYCERIDES: 205 mg/dL — AB (ref 0.0–149.0)
Total CHOL/HDL Ratio: 4
VLDL: 41 mg/dL — ABNORMAL HIGH (ref 0.0–40.0)

## 2015-12-02 LAB — TSH: TSH: 2.09 u[IU]/mL (ref 0.35–4.50)

## 2015-12-02 LAB — LDL CHOLESTEROL, DIRECT: LDL DIRECT: 100 mg/dL

## 2015-12-14 ENCOUNTER — Encounter: Payer: Self-pay | Admitting: Family Medicine

## 2015-12-14 ENCOUNTER — Ambulatory Visit (INDEPENDENT_AMBULATORY_CARE_PROVIDER_SITE_OTHER): Payer: BLUE CROSS/BLUE SHIELD | Admitting: Family Medicine

## 2015-12-14 VITALS — BP 124/80 | HR 73 | Temp 98.0°F | Ht 61.75 in | Wt 149.0 lb

## 2015-12-14 DIAGNOSIS — Z0000 Encounter for general adult medical examination without abnormal findings: Secondary | ICD-10-CM

## 2015-12-14 NOTE — Patient Instructions (Signed)

## 2015-12-14 NOTE — Progress Notes (Signed)
   Subjective:    Patient ID: Heather Adams, female    DOB: 1951-01-07, 65 y.o.   MRN: [id number]  HPI  Here for CPE.  Still sees GYN yearly. Hyperlipidemia treated with Lipitor.  No hx of CAD. Exercises regularly.  Non-smoker. DEXA 9/16 normal density.   Past history of bilateral hip replacements secondary to osteoarthritis Health maintenance reviewed:  Health Maintenance  Topic Date Due  . Hepatitis C Screening  1950-10-11  . HIV Screening  07/20/1967  . INFLUENZA VACCINE  01/03/2016  . PAP SMEAR  01/14/2017  . MAMMOGRAM  02/14/2017  . TETANUS/TDAP  10/01/2020  . COLONOSCOPY  01/16/2022  . ZOSTAVAX  Completed     Review of Systems  Constitutional: Negative for fever, activity change, appetite change, fatigue and unexpected weight change.  HENT: Negative for ear pain, hearing loss, sore throat and trouble swallowing.   Eyes: Negative for visual disturbance.  Respiratory: Negative for cough and shortness of breath.   Cardiovascular: Negative for chest pain and palpitations.  Gastrointestinal: Negative for abdominal pain, diarrhea, constipation and blood in stool.  Endocrine: Negative for polydipsia and polyuria.  Genitourinary: Negative for dysuria and hematuria.  Musculoskeletal: Negative for myalgias, back pain and arthralgias.  Skin: Negative for rash.  Neurological: Negative for dizziness, syncope and headaches.  Hematological: Negative for adenopathy.  Psychiatric/Behavioral: Negative for confusion and dysphoric mood.       Objective:   Physical Exam  Constitutional: She is oriented to person, place, and time. She appears well-developed and well-nourished.  HENT:  Head: Normocephalic and atraumatic.  Eyes: EOM are normal. Pupils are equal, round, and reactive to light.  Neck: Normal range of motion. Neck supple. No thyromegaly present.  Cardiovascular: Normal rate, regular rhythm and normal heart sounds.   No murmur heard. Pulmonary/Chest: Breath sounds normal.  No respiratory distress. She has no wheezes. She has no rales.  Abdominal: Soft. Bowel sounds are normal. She exhibits no distension and no mass. There is no tenderness. There is no rebound and no guarding.  Genitourinary:  Per GYN  Musculoskeletal: Normal range of motion. She exhibits no edema.  Lymphadenopathy:    She has no cervical adenopathy.  Neurological: She is alert and oriented to person, place, and time. She displays normal reflexes. No cranial nerve deficit.  Skin: No rash noted.  Psychiatric: She has a normal mood and affect. Her behavior is normal. Judgment and thought content normal.          Assessment & Plan:  Physical exam. Labs reviewed. Mildly elevated triglycerides. We discussed dietary factors to address. She is encouraged try to lose some weight. Continue yearly flu vaccination. Other immunizations up-to-date. She will continue GYN follow-up. We discussed hepatitis C screening. She has no specific risk factors.  Eulas Post MD [facility]

## 2015-12-14 NOTE — Progress Notes (Signed)
Pre visit review using our clinic review tool, if applicable. No additional management support is needed unless otherwise documented below in the visit note. 

## 2016-02-27 ENCOUNTER — Other Ambulatory Visit: Payer: Self-pay | Admitting: Family Medicine

## 2016-03-13 ENCOUNTER — Ambulatory Visit: Payer: BLUE CROSS/BLUE SHIELD

## 2016-03-21 ENCOUNTER — Ambulatory Visit (INDEPENDENT_AMBULATORY_CARE_PROVIDER_SITE_OTHER): Payer: BLUE CROSS/BLUE SHIELD

## 2016-03-21 DIAGNOSIS — Z23 Encounter for immunization: Secondary | ICD-10-CM

## 2016-05-18 ENCOUNTER — Ambulatory Visit (INDEPENDENT_AMBULATORY_CARE_PROVIDER_SITE_OTHER): Payer: BLUE CROSS/BLUE SHIELD | Admitting: Family Medicine

## 2016-05-18 VITALS — BP 138/80 | HR 90 | Temp 98.2°F | Ht 61.75 in | Wt 152.0 lb

## 2016-05-18 DIAGNOSIS — R42 Dizziness and giddiness: Secondary | ICD-10-CM

## 2016-05-18 NOTE — Patient Instructions (Signed)
Epley Maneuver Self-Care WHAT IS THE EPLEY MANEUVER? The Epley maneuver is an exercise you can do to relieve symptoms of benign paroxysmal positional vertigo (BPPV). This condition is often just referred to as vertigo. BPPV is caused by the movement of tiny crystals (canaliths) inside your inner ear. The accumulation and movement of canaliths in your inner ear causes a sudden spinning sensation (vertigo) when you move your head to certain positions. Vertigo usually lasts about 30 seconds. BPPV usually occurs in just one ear. If you get vertigo when you lie on your left side, you probably have BPPV in your left ear. Your health care provider can tell you which ear is involved.  BPPV may be caused by a head injury. Many people older than 50 get BPPV for unknown reasons. If you have been diagnosed with BPPV, your health care provider may teach you how to do this maneuver. BPPV is not life threatening (benign) and usually goes away in time.  WHEN SHOULD I PERFORM THE EPLEY MANEUVER? You can do this maneuver at home whenever you have symptoms of vertigo. You may do the Epley maneuver up to 3 times a day until your symptoms of vertigo go away. HOW SHOULD I DO THE EPLEY MANEUVER? 1. Sit on the edge of a bed or table with your back straight. Your legs should be extended or hanging over the edge of the bed or table.  2. Turn your head halfway toward the affected ear.  3. Lie backward quickly with your head turned until you are lying flat on your back. You may want to position a pillow under your shoulders.  4. Hold this position for 30 seconds. You may experience an attack of vertigo. This is normal. Hold this position until the vertigo stops. 5. Then turn your head to the opposite direction until your unaffected ear is facing the floor.  6. Hold this position for 30 seconds. You may experience an attack of vertigo. This is normal. Hold this position until the vertigo stops. 7. Now turn your whole body to  the same side as your head. Hold for another 30 seconds.  8. You can then sit back up. ARE THERE RISKS TO THIS MANEUVER? In some cases, you may have other symptoms (such as changes in your vision, weakness, or numbness). If you have these symptoms, stop doing the maneuver and call your health care provider. Even if doing these maneuvers relieves your vertigo, you may still have dizziness. Dizziness is the sensation of light-headedness but without the sensation of movement. Even though the Epley maneuver may relieve your vertigo, it is possible that your symptoms will return within 5 years. WHAT SHOULD I DO AFTER THIS MANEUVER? After doing the Epley maneuver, you can return to your normal activities. Ask your doctor if there is anything you should do at home to prevent vertigo. This may include:  Sleeping with two or more pillows to keep your head elevated.  Not sleeping on the side of your affected ear.  Getting up slowly from bed.  Avoiding sudden movements during the day.  Avoiding extreme head movement, like looking up or bending over.  Wearing a cervical collar to prevent sudden head movements. WHAT SHOULD I DO IF MY SYMPTOMS GET WORSE? Call your health care provider if your vertigo gets worse. Call your provider right way if you have other symptoms, including:   Nausea.  Vomiting.  Headache.  Weakness.  Numbness.  Vision changes. This information is not intended to replace  advice given to you by your health care provider. Make sure you discuss any questions you have with your health care provider. Document Released: 05/26/2013 Document Reviewed: 05/26/2013 Elsevier Interactive Patient Education  2017 Elsevier Inc[employer] Benign Positional Vertigo Introduction Vertigo is the feeling that you or your surroundings are moving when they are not. Benign positional vertigo is the most common form of vertigo. The cause of this condition is not serious (is benign). This condition is  triggered by certain movements and positions (is positional). This condition can be dangerous if it occurs while you are doing something that could endanger you or others, such as driving. What are the causes? In many cases, the cause of this condition is not known. It may be caused by a disturbance in an area of the inner ear that helps your brain to sense movement and balance. This disturbance can be caused by a viral infection (labyrinthitis), head injury, or repetitive motion. What increases the risk? This condition is more likely to develop in:  Women.  People who are 4 years of age or older. What are the signs or symptoms? Symptoms of this condition usually happen when you move your head or your eyes in different directions. Symptoms may start suddenly, and they usually last for less than a minute. Symptoms may include:  Loss of balance and falling.  Feeling like you are spinning or moving.  Feeling like your surroundings are spinning or moving.  Nausea and vomiting.  Blurred vision.  Dizziness.  Involuntary eye movement (nystagmus). Symptoms can be mild and cause only slight annoyance, or they can be severe and interfere with daily life. Episodes of benign positional vertigo may return (recur) over time, and they may be triggered by certain movements. Symptoms may improve over time. How is this diagnosed? This condition is usually diagnosed by medical history and a physical exam of the head, neck, and ears. You may be referred to a health care provider who specializes in ear, nose, and throat (ENT) problems (otolaryngologist) or a provider who specializes in disorders of the nervous system (neurologist). You may have additional testing, including:  MRI.  A CT scan.  Eye movement tests. Your health care provider may ask you to change positions quickly while he or she watches you for symptoms of benign positional vertigo, such as nystagmus. Eye movement may be tested with an  electronystagmogram (ENG), caloric stimulation, the Dix-Hallpike test, or the roll test.  An electroencephalogram (EEG). This records electrical activity in your brain.  Hearing tests. How is this treated? Usually, your health care provider will treat this by moving your head in specific positions to adjust your inner ear back to normal. Surgery may be needed in severe cases, but this is rare. In some cases, benign positional vertigo may resolve on its own in 2-4 weeks. Follow these instructions at home: Safety  Move slowly.Avoid sudden body or head movements.  Avoid driving.  Avoid operating heavy machinery.  Avoid doing any tasks that would be dangerous to you or others if a vertigo episode would occur.  If you have trouble walking or keeping your balance, try using a cane for stability. If you feel dizzy or unstable, sit down right away.  Return to your normal activities as told by your health care provider. Ask your health care provider what activities are safe for you. General instructions  Take over-the-counter and prescription medicines only as told by your health care provider.  Avoid certain positions or movements as told by  your health care provider.  Drink enough fluid to keep your urine clear or pale yellow.  Keep all follow-up visits as told by your health care provider. This is important. Contact a health care provider if:  You have a fever.  Your condition gets worse or you develop new symptoms.  Your family or friends notice any behavioral changes.  Your nausea or vomiting gets worse.  You have numbness or a "pins and needles" sensation. Get help right away if:  You have difficulty speaking or moving.  You are always dizzy.  You faint.  You develop severe headaches.  You have weakness in your legs or arms.  You have changes in your hearing or vision.  You develop a stiff neck.  You develop sensitivity to light. This information is not intended  to replace advice given to you by your health care provider. Make sure you discuss any questions you have with your health care provider. Document Released: 02/26/2006 Document Revised: 10/27/2015 Document Reviewed: 09/13/2014  2017 Elsevier

## 2016-05-18 NOTE — Progress Notes (Signed)
Subjective:     Patient ID: Heather Adams, female   DOB: [birth year], 65 y.o.   MRN: [id number]  HPI   Patient seen with 2 day history of some dizziness. This is described more as vertigo. She noticed some very mild symptoms  Wednesday night and then Thursday morning symptoms were worse. There were very transient and she describes vertigo worse with head movement. She went for electrolysis procedure and when getting up from that was the worst episode. Symptoms only lasted seconds. She's not had any major headache, hearing loss, speech changes, ataxia, or any swallowing difficulties. She's had similar symptoms in the past. Very minimal symptoms today  Past Medical History:  Diagnosis Date  . Fainting spell    with blood draws   . Hyperlipidemia   . Osteoarthritis of hip    right   Past Surgical History:  Procedure Laterality Date  . COLONOSCOPY  01-17-12   per Dr. Darlen Round in [place], hemorrhoids, no polyps, repeat in 5 yrs   . endometrosis  1986  . Hemmorriod sugery  04-04-2015  . hisogram  1986  . TONSILLECTOMY  1977  . TOTAL HIP ARTHROPLASTY  [id number]    reports that she has never smoked. She has never used smokeless tobacco. She reports that she drinks alcohol. She reports that she does not use drugs. family history includes Hearing loss in her paternal aunt, paternal grandfather, and paternal uncle; Heart disease (age of onset: 60) in her paternal grandfather; Hyperlipidemia in her mother. Allergies  Allergen Reactions  . Thimerosal      Review of Systems  Constitutional: Negative for appetite change and unexpected weight change.  Respiratory: Negative for cough and shortness of breath.   Cardiovascular: Negative for chest pain, palpitations and leg swelling.  Neurological: Positive for dizziness. Negative for syncope and weakness.  Psychiatric/Behavioral: Negative for confusion.       Objective:   Physical Exam  Constitutional: She is oriented to person, place,  and time. She appears well-developed and well-nourished.  HENT:  Right Ear: External ear normal.  Left Ear: External ear normal.  Mouth/Throat: Oropharynx is clear and moist.  Eyes: Pupils are equal, round, and reactive to light.  Neck: Neck supple. No thyromegaly present.  Musculoskeletal: She exhibits no edema.  Neurological: She is alert and oriented to person, place, and time. No cranial nerve deficit.  No focal weakness.  Finger to nose normal.  Gait normal.         Assessment:     Transient vertigo symptoms. Suspect benign peripheral positional vertigo. Nonfocal exam neurologically. Symptoms were triggered minimally with lying patient supine with head to the right but not the left    Plan:     -Observation for now. -Consider Epley maneuvers if symptoms persist -Handout given on things to watch for  Eulas Post MD [facility] at [facility]

## 2017-01-14 ENCOUNTER — Ambulatory Visit (INDEPENDENT_AMBULATORY_CARE_PROVIDER_SITE_OTHER): Payer: BLUE CROSS/BLUE SHIELD | Admitting: Family Medicine

## 2017-01-14 ENCOUNTER — Encounter: Payer: Self-pay | Admitting: Family Medicine

## 2017-01-14 VITALS — BP 102/68 | HR 74 | Temp 98.2°F | Ht 62.0 in | Wt 148.8 lb

## 2017-01-14 DIAGNOSIS — Z0000 Encounter for general adult medical examination without abnormal findings: Secondary | ICD-10-CM

## 2017-01-14 DIAGNOSIS — E785 Hyperlipidemia, unspecified: Secondary | ICD-10-CM | POA: Diagnosis not present

## 2017-01-14 LAB — CBC WITH DIFFERENTIAL/PLATELET
BASOS PCT: 1.7 % (ref 0.0–3.0)
Basophils Absolute: 0.1 10*3/uL (ref 0.0–0.1)
EOS ABS: 0.1 10*3/uL (ref 0.0–0.7)
Eosinophils Relative: 2.8 % (ref 0.0–5.0)
HCT: 41.6 % (ref 36.0–46.0)
HEMOGLOBIN: 13.9 g/dL (ref 12.0–15.0)
Lymphocytes Relative: 33.5 % (ref 12.0–46.0)
Lymphs Abs: 1.6 10*3/uL (ref 0.7–4.0)
MCHC: 33.3 g/dL (ref 30.0–36.0)
MCV: 91.3 fl (ref 78.0–100.0)
MONO ABS: 0.3 10*3/uL (ref 0.1–1.0)
Monocytes Relative: 6.1 % (ref 3.0–12.0)
NEUTROS PCT: 55.9 % (ref 43.0–77.0)
Neutro Abs: 2.6 10*3/uL (ref 1.4–7.7)
Platelets: 267 10*3/uL (ref 150.0–400.0)
RBC: 4.56 Mil/uL (ref 3.87–5.11)
RDW: 13.5 % (ref 11.5–15.5)
WBC: 4.6 10*3/uL (ref 4.0–10.5)

## 2017-01-14 LAB — LIPID PANEL
CHOLESTEROL: 168 mg/dL (ref 0–200)
HDL: 49.4 mg/dL (ref >39.00)
LDL CALC: 93 mg/dL (ref 0–99)
NONHDL: 118.89
Total CHOL/HDL Ratio: 3
Triglycerides: 127 mg/dL (ref 0.0–149.0)
VLDL: 25.4 mg/dL (ref 0.0–40.0)

## 2017-01-14 LAB — BASIC METABOLIC PANEL
BUN: 10 mg/dL (ref 6–23)
CHLORIDE: 105 meq/L (ref 96–112)
CO2: 27 mEq/L (ref 19–32)
Calcium: 9.7 mg/dL (ref 8.4–10.5)
Creatinine, Ser: 0.89 mg/dL (ref 0.40–1.20)
GFR: 67.76 mL/min (ref >60.00)
GLUCOSE: 89 mg/dL (ref 70–99)
POTASSIUM: 4.1 meq/L (ref 3.5–5.1)
Sodium: 141 mEq/L (ref 135–145)

## 2017-01-14 LAB — HEPATIC FUNCTION PANEL
ALT: 14 U/L (ref 0–35)
AST: 18 U/L (ref 0–37)
Albumin: 5 g/dL (ref 3.5–5.2)
Alkaline Phosphatase: 70 U/L (ref 39–117)
BILIRUBIN TOTAL: 1 mg/dL (ref 0.2–1.2)
Bilirubin, Direct: 0.2 mg/dL (ref 0.0–0.3)
Total Protein: 6.8 g/dL (ref 6.0–8.3)

## 2017-01-14 LAB — TSH: TSH: 2.22 u[IU]/mL (ref 0.35–4.50)

## 2017-01-14 NOTE — Progress Notes (Signed)
Subjective:     Patient ID: Heather Adams, female   DOB: 1950/10/17, 66 y.o.   MRN: [id number]  HPI Patient seen for physical exam. She has hyperlipidemia on atorvastatin. Takes no other regular medications. She's had bilateral hip replacement secondary to osteoarthritis. No history of shingles vaccine. No history of hepatitis C screening. Low risk. Colonoscopy up-to-date. Other immunizations up-to-date. Generally feels well. She stays very active. Consistent exercise. Appetite and weight stable.  She continues to see GYN for Pap smears and mammograms  Past Medical History:  Diagnosis Date  . Fainting spell    with blood draws   . Hyperlipidemia   . Osteoarthritis of hip    right   Past Surgical History:  Procedure Laterality Date  . COLONOSCOPY  01-17-12   per Dr. Darlen Round in [place], hemorrhoids, no polyps, repeat in 5 yrs   . endometrosis  1986  . Hemmorriod sugery  04-04-2015  . hisogram  1986  . TONSILLECTOMY  1977  . TOTAL HIP ARTHROPLASTY  [id number]    reports that she has never smoked. She has never used smokeless tobacco. She reports that she drinks alcohol. She reports that she does not use drugs. family history includes Hearing loss in her paternal aunt, paternal grandfather, and paternal uncle; Heart disease (age of onset: 38) in her paternal grandfather; Hyperlipidemia in her mother. Allergies  Allergen Reactions  . Thimerosal      Review of Systems  Constitutional: Negative for activity change, appetite change, fatigue, fever and unexpected weight change.  HENT: Negative for ear pain, hearing loss, sore throat and trouble swallowing.   Eyes: Negative for visual disturbance.  Respiratory: Negative for cough and shortness of breath.   Cardiovascular: Negative for chest pain and palpitations.  Gastrointestinal: Negative for abdominal pain, blood in stool, constipation and diarrhea.  Genitourinary: Negative for dysuria and hematuria.  Musculoskeletal: Negative  for arthralgias, back pain and myalgias.  Skin: Negative for rash.  Neurological: Negative for dizziness, syncope and headaches.  Hematological: Negative for adenopathy.  Psychiatric/Behavioral: Negative for confusion and dysphoric mood.       Objective:   Physical Exam  Constitutional: She is oriented to person, place, and time. She appears well-developed and well-nourished.  HENT:  Head: Normocephalic and atraumatic.  Eyes: Pupils are equal, round, and reactive to light. EOM are normal.  Neck: Normal range of motion. Neck supple. No thyromegaly present.  Cardiovascular: Normal rate, regular rhythm and normal heart sounds.   No murmur heard. Pulmonary/Chest: Breath sounds normal. No respiratory distress. She has no wheezes. She has no rales.  Abdominal: Soft. Bowel sounds are normal. She exhibits no distension and no mass. There is no tenderness. There is no rebound and no guarding.  Musculoskeletal: Normal range of motion. She exhibits no edema.  Lymphadenopathy:    She has no cervical adenopathy.  Neurological: She is alert and oriented to person, place, and time. She displays normal reflexes. No cranial nerve deficit.  Skin: No rash noted.  Psychiatric: She has a normal mood and affect. Her behavior is normal. Judgment and thought content normal.       Assessment:     Physical exam. Hyperlipidemia treated with Lipitor. Otherwise no major medical problems    Plan:     -she will continue with regular GYN follow-up -We discussed new shingles vaccine and she will check on insurance coverage -Screening labs and include hepatitis C antibody  Eulas Post MD [facility] at [facility]

## 2017-01-14 NOTE — Patient Instructions (Signed)
Consider new Shingles vaccine- Shingrix.

## 2017-01-15 LAB — HEPATITIS C ANTIBODY: HCV Ab: NONREACTIVE

## 2017-02-21 ENCOUNTER — Encounter: Payer: Self-pay | Admitting: Family Medicine

## 2017-03-11 ENCOUNTER — Other Ambulatory Visit: Payer: Self-pay

## 2017-03-11 MED ORDER — ATORVASTATIN CALCIUM 20 MG PO TABS: ORAL_TABLET | ORAL | 3 refills | Status: DC

## 2017-03-11 NOTE — Telephone Encounter (Signed)
Refill for medication sent.

## 2017-03-19 ENCOUNTER — Other Ambulatory Visit: Payer: Self-pay | Admitting: *Deleted

## 2017-03-19 MED ORDER — ATORVASTATIN CALCIUM 20 MG PO TABS: ORAL_TABLET | ORAL | 3 refills | Status: DC

## 2017-03-22 ENCOUNTER — Ambulatory Visit (INDEPENDENT_AMBULATORY_CARE_PROVIDER_SITE_OTHER): Payer: BLUE CROSS/BLUE SHIELD

## 2017-03-22 DIAGNOSIS — Z23 Encounter for immunization: Secondary | ICD-10-CM

## 2017-04-02 ENCOUNTER — Ambulatory Visit: Payer: BLUE CROSS/BLUE SHIELD

## 2017-04-18 ENCOUNTER — Ambulatory Visit (INDEPENDENT_AMBULATORY_CARE_PROVIDER_SITE_OTHER): Payer: BLUE CROSS/BLUE SHIELD | Admitting: *Deleted

## 2017-04-18 DIAGNOSIS — Z23 Encounter for immunization: Secondary | ICD-10-CM

## 2017-04-18 NOTE — Progress Notes (Signed)
Patient here for Shingrix #1. Patient tolerated injection well. Instructed patient to return in 2-6 months for second vaccine.   Dorrene German, RN

## 2017-06-11 LAB — HM COLONOSCOPY

## 2017-06-20 ENCOUNTER — Ambulatory Visit (INDEPENDENT_AMBULATORY_CARE_PROVIDER_SITE_OTHER): Payer: BLUE CROSS/BLUE SHIELD | Admitting: Family Medicine

## 2017-06-20 DIAGNOSIS — Z23 Encounter for immunization: Secondary | ICD-10-CM | POA: Diagnosis not present

## 2017-06-21 ENCOUNTER — Encounter: Payer: Self-pay | Admitting: Family Medicine

## 2017-08-14 DIAGNOSIS — K219 Gastro-esophageal reflux disease without esophagitis: Secondary | ICD-10-CM | POA: Diagnosis not present

## 2017-08-14 DIAGNOSIS — E785 Hyperlipidemia, unspecified: Secondary | ICD-10-CM | POA: Diagnosis not present

## 2017-08-14 DIAGNOSIS — Z79899 Other long term (current) drug therapy: Secondary | ICD-10-CM | POA: Diagnosis not present

## 2017-08-14 DIAGNOSIS — R0789 Other chest pain: Secondary | ICD-10-CM | POA: Diagnosis not present

## 2017-08-14 DIAGNOSIS — Z888 Allergy status to other drugs, medicaments and biological substances status: Secondary | ICD-10-CM | POA: Diagnosis not present

## 2017-08-15 DIAGNOSIS — R079 Chest pain, unspecified: Secondary | ICD-10-CM | POA: Diagnosis not present

## 2017-08-15 DIAGNOSIS — K219 Gastro-esophageal reflux disease without esophagitis: Secondary | ICD-10-CM | POA: Diagnosis not present

## 2017-08-15 DIAGNOSIS — E785 Hyperlipidemia, unspecified: Secondary | ICD-10-CM | POA: Diagnosis not present

## 2017-08-17 MED ORDER — SODIUM CHLORIDE 0.9 % IV SOLN
INTRAVENOUS | Status: DC
Start: ? — End: 2017-08-17

## 2017-08-17 MED ORDER — GENERIC EXTERNAL MEDICATION
Status: DC
Start: ? — End: 2017-08-17

## 2017-08-17 MED ORDER — PANTOPRAZOLE SODIUM 40 MG PO TBEC
40.00 | DELAYED_RELEASE_TABLET | ORAL | Status: DC
Start: 2017-08-16 — End: 2017-08-17

## 2017-08-17 MED ORDER — SENNA-DOCUSATE SODIUM 8.6-50 MG PO TABS
ORAL_TABLET | ORAL | Status: DC
Start: ? — End: 2017-08-17

## 2017-08-17 MED ORDER — ATORVASTATIN CALCIUM 20 MG PO TABS
20.00 | ORAL_TABLET | ORAL | Status: DC
Start: 2017-08-15 — End: 2017-08-17

## 2017-08-17 MED ORDER — NITROGLYCERIN 0.4 MG SL SUBL
0.40 | SUBLINGUAL_TABLET | SUBLINGUAL | Status: DC
Start: ? — End: 2017-08-17

## 2017-08-17 MED ORDER — ENOXAPARIN SODIUM 40 MG/0.4ML ~~LOC~~ SOLN
40.00 | SUBCUTANEOUS | Status: DC
Start: 2017-08-15 — End: 2017-08-17

## 2017-08-17 MED ORDER — FAMOTIDINE 20 MG PO TABS
10.00 | ORAL_TABLET | ORAL | Status: DC
Start: 2017-08-15 — End: 2017-08-17

## 2017-08-17 MED ORDER — ASPIRIN EC 81 MG PO TBEC
81.00 | DELAYED_RELEASE_TABLET | ORAL | Status: DC
Start: 2017-08-16 — End: 2017-08-17

## 2017-09-11 DIAGNOSIS — R079 Chest pain, unspecified: Secondary | ICD-10-CM | POA: Diagnosis not present

## 2017-09-11 DIAGNOSIS — K219 Gastro-esophageal reflux disease without esophagitis: Secondary | ICD-10-CM | POA: Diagnosis not present

## 2018-01-17 ENCOUNTER — Ambulatory Visit (INDEPENDENT_AMBULATORY_CARE_PROVIDER_SITE_OTHER): Payer: BLUE CROSS/BLUE SHIELD | Admitting: Family Medicine

## 2018-01-17 ENCOUNTER — Encounter: Payer: Self-pay | Admitting: Family Medicine

## 2018-01-17 VITALS — BP 120/80 | HR 66 | Temp 98.2°F | Ht 62.0 in | Wt 149.5 lb

## 2018-01-17 DIAGNOSIS — Z0000 Encounter for general adult medical examination without abnormal findings: Secondary | ICD-10-CM | POA: Diagnosis not present

## 2018-01-17 DIAGNOSIS — Z23 Encounter for immunization: Secondary | ICD-10-CM

## 2018-01-17 LAB — HEPATIC FUNCTION PANEL
ALBUMIN: 4.8 g/dL (ref 3.5–5.2)
ALT: 16 U/L (ref 0–35)
AST: 17 U/L (ref 0–37)
Alkaline Phosphatase: 60 U/L (ref 39–117)
BILIRUBIN DIRECT: 0.2 mg/dL (ref 0.0–0.3)
Total Bilirubin: 1 mg/dL (ref 0.2–1.2)
Total Protein: 7.2 g/dL (ref 6.0–8.3)

## 2018-01-17 LAB — BASIC METABOLIC PANEL
BUN: 19 mg/dL (ref 6–23)
CHLORIDE: 106 meq/L (ref 96–112)
CO2: 29 mEq/L (ref 19–32)
Calcium: 10.1 mg/dL (ref 8.4–10.5)
Creatinine, Ser: 1 mg/dL (ref 0.40–1.20)
GFR: 59.05 mL/min — ABNORMAL LOW (ref >60.00)
Glucose, Bld: 90 mg/dL (ref 70–99)
Potassium: 5.1 mEq/L (ref 3.5–5.1)
SODIUM: 141 meq/L (ref 135–145)

## 2018-01-17 LAB — LIPID PANEL
CHOL/HDL RATIO: 3
Cholesterol: 159 mg/dL (ref 0–200)
HDL: 51 mg/dL (ref >39.00)
LDL CALC: 77 mg/dL (ref 0–99)
NonHDL: 108.24
TRIGLYCERIDES: 157 mg/dL — AB (ref 0.0–149.0)
VLDL: 31.4 mg/dL (ref 0.0–40.0)

## 2018-01-17 LAB — CBC WITH DIFFERENTIAL/PLATELET
BASOS ABS: 0.1 10*3/uL (ref 0.0–0.1)
BASOS PCT: 1.1 % (ref 0.0–3.0)
EOS ABS: 0.1 10*3/uL (ref 0.0–0.7)
Eosinophils Relative: 2.8 % (ref 0.0–5.0)
HCT: 40.8 % (ref 36.0–46.0)
Hemoglobin: 13.7 g/dL (ref 12.0–15.0)
LYMPHS ABS: 1.7 10*3/uL (ref 0.7–4.0)
Lymphocytes Relative: 34.1 % (ref 12.0–46.0)
MCHC: 33.5 g/dL (ref 30.0–36.0)
MCV: 89.4 fl (ref 78.0–100.0)
MONOS PCT: 7.1 % (ref 3.0–12.0)
Monocytes Absolute: 0.4 10*3/uL (ref 0.1–1.0)
NEUTROS ABS: 2.8 10*3/uL (ref 1.4–7.7)
NEUTROS PCT: 54.9 % (ref 43.0–77.0)
PLATELETS: 250 10*3/uL (ref 150.0–400.0)
RBC: 4.56 Mil/uL (ref 3.87–5.11)
RDW: 13.6 % (ref 11.5–15.5)
WBC: 5.1 10*3/uL (ref 4.0–10.5)

## 2018-01-17 LAB — TSH: TSH: 2.27 u[IU]/mL (ref 0.35–4.50)

## 2018-01-17 MED ORDER — ATORVASTATIN CALCIUM 20 MG PO TABS: ORAL_TABLET | ORAL | 3 refills | Status: DC

## 2018-01-17 NOTE — Progress Notes (Signed)
Subjective:     Patient ID: Heather Adams, female   DOB: 1951-04-04, 67 y.o.   MRN: [id number]  HPI Patient seen for physical exam. She still sees gynecologist yearly. She is getting mammograms through their office. Her colonoscopy is up-to-date. She plans to get high-dose flu vaccine later this year. No history of Prevnar. She turned 67 this year. She had DEXA scan 2016 with osteopenia in the forearm but normal at the spine. She's had previous hip replacements.  Hyperlipidemia and remains on Lipitor. Requesting refills. Never smoked. No regular alcohol use.  Past Medical History:  Diagnosis Date  . Fainting spell    with blood draws   . Hyperlipidemia   . Osteoarthritis of hip    right   Past Surgical History:  Procedure Laterality Date  . COLONOSCOPY  01-17-12   per Dr. Darlen Round in [place], hemorrhoids, no polyps, repeat in 5 yrs   . endometrosis  1986  . Hemmorriod sugery  04-04-2015  . hisogram  1986  . TONSILLECTOMY  1977  . TOTAL HIP ARTHROPLASTY  [id number]    reports that she has never smoked. She has never used smokeless tobacco. She reports that she drinks alcohol. She reports that she does not use drugs. family history includes Hearing loss in her paternal aunt, paternal grandfather, and paternal uncle; Heart disease (age of onset: 89) in her paternal grandfather; Hyperlipidemia in her mother. Allergies  Allergen Reactions  . Thimerosal      Review of Systems  Constitutional: Negative for activity change, appetite change, fatigue, fever and unexpected weight change.  HENT: Negative for ear pain, hearing loss, sore throat and trouble swallowing.   Eyes: Negative for visual disturbance.  Respiratory: Negative for cough and shortness of breath.   Cardiovascular: Negative for chest pain and palpitations.  Gastrointestinal: Negative for abdominal pain, blood in stool, constipation and diarrhea.  Genitourinary: Negative for dysuria and hematuria.   Musculoskeletal: Negative for arthralgias, back pain and myalgias.  Skin: Negative for rash.  Neurological: Negative for dizziness, syncope and headaches.  Hematological: Negative for adenopathy.  Psychiatric/Behavioral: Negative for confusion and dysphoric mood.       Objective:   Physical Exam  Constitutional: She is oriented to person, place, and time. She appears well-developed and well-nourished.  HENT:  Head: Normocephalic and atraumatic.  Eyes: Pupils are equal, round, and reactive to light. EOM are normal.  Neck: Normal range of motion. Neck supple. No thyromegaly present.  Cardiovascular: Normal rate, regular rhythm and normal heart sounds.  No murmur heard. Pulmonary/Chest: Breath sounds normal. No respiratory distress. She has no wheezes. She has no rales.  Abdominal: Soft. Bowel sounds are normal. She exhibits no distension and no mass. There is no tenderness. There is no rebound and no guarding.  Musculoskeletal: Normal range of motion. She exhibits no edema.  Lymphadenopathy:    She has no cervical adenopathy.  Neurological: She is alert and oriented to person, place, and time. She displays normal reflexes. No cranial nerve deficit.  Skin: No rash noted.  Psychiatric: She has a normal mood and affect. Her behavior is normal. Judgment and thought content normal.       Assessment:     Physical exam. Several health maintenance issues addressed as below     Plan:     -Obtain screening lab work -Prevnar 13 given and recommended Pneumovax in 12 months -Consider repeat DEXA scan -She will continue mammograms through [facility] regular weightbearing exercise -Reminder for flu vaccine later this  fall  Eulas Post MD [facility] at [facility]

## 2018-01-17 NOTE — Patient Instructions (Signed)
Consider repeat DEXA scan later this year.  Remember flu vaccine this Fall

## 2018-01-18 ENCOUNTER — Encounter: Payer: Self-pay | Admitting: Family Medicine

## 2018-02-24 ENCOUNTER — Ambulatory Visit (INDEPENDENT_AMBULATORY_CARE_PROVIDER_SITE_OTHER): Payer: BLUE CROSS/BLUE SHIELD | Admitting: *Deleted

## 2018-02-24 DIAGNOSIS — Z23 Encounter for immunization: Secondary | ICD-10-CM

## 2018-02-24 NOTE — Progress Notes (Signed)
Per orders of Dr. Elease Hashimoto, injection of influenza vaccine given by Agnes Lawrence. Patient tolerated injection well.

## 2018-03-03 ENCOUNTER — Ambulatory Visit: Payer: BLUE CROSS/BLUE SHIELD

## 2018-04-01 DIAGNOSIS — Z1231 Encounter for screening mammogram for malignant neoplasm of breast: Secondary | ICD-10-CM | POA: Diagnosis not present

## 2018-04-18 ENCOUNTER — Encounter: Payer: Self-pay | Admitting: Family Medicine

## 2018-04-18 ENCOUNTER — Ambulatory Visit (INDEPENDENT_AMBULATORY_CARE_PROVIDER_SITE_OTHER): Payer: BLUE CROSS/BLUE SHIELD | Admitting: Family Medicine

## 2018-04-18 ENCOUNTER — Other Ambulatory Visit: Payer: Self-pay

## 2018-04-18 VITALS — BP 108/68 | HR 76 | Temp 98.2°F | Ht 62.0 in | Wt 151.3 lb

## 2018-04-18 DIAGNOSIS — R21 Rash and other nonspecific skin eruption: Secondary | ICD-10-CM | POA: Diagnosis not present

## 2018-04-18 NOTE — Patient Instructions (Signed)
Consider OTC hydrocortisone cream once or twice daily  Let us know if rash is enlarging rapidly.

## 2018-04-18 NOTE — Progress Notes (Signed)
  Subjective:     Patient ID: Heather Adams, female   DOB: 17-Nov-1950, 67 y.o.   MRN: [id number]  HPI Patient seen with skin rash left forearm.  First noted about  2 or 3 weeks ago.  Mild pruritus.  No pain.  Denies any other areas of rash.  She has not tried anything topically.  No exacerbating or alleviating factors.  No history of similar rash.  Past Medical History:  Diagnosis Date  . Fainting spell    with blood draws   . Hyperlipidemia   . Osteoarthritis of hip    right   Past Surgical History:  Procedure Laterality Date  . COLONOSCOPY  01-17-12   per Dr. Darlen Round in [place], hemorrhoids, no polyps, repeat in 5 yrs   . endometrosis  1986  . Hemmorriod sugery  04-04-2015  . hisogram  1986  . TONSILLECTOMY  1977  . TOTAL HIP ARTHROPLASTY  [id number]    reports that she has never smoked. She has never used smokeless tobacco. She reports that she drinks alcohol. She reports that she does not use drugs. family history includes Hearing loss in her paternal aunt, paternal grandfather, and paternal uncle; Heart disease (age of onset: 32) in her paternal grandfather; Hyperlipidemia in her mother. Allergies  Allergen Reactions  . Thimerosal      Review of Systems  Constitutional: Negative for chills and fever.  Skin: Positive for rash.  Hematological: Negative for adenopathy.       Objective:   Physical Exam  Constitutional: She appears well-developed and well-nourished.  Cardiovascular: Normal rate and regular rhythm.  Pulmonary/Chest: Effort normal and breath sounds normal.  Skin: Rash noted.  Has approximately 1 cm circumferential macular to minimally raised mostly nonblanching slightly erythematous rash which is slightly scaly.  No clearing near the center.  No elevated border.       Assessment:     Macular skin rash left forearm.  Etiology unclear.  Not typical of ringworm.  No evidence for granuloma annulare.  Doubt nummular eczema.  Question irritated  seborrheic keratosis.  Appears benign.    Plan:     -Recommend she try over-the-counter 1% hydrocortisone cream as needed for itching -She already has follow-up with her dermatologist in January and we feel like it be okay to wait till then for further evaluation.  Follow-up sooner for any rapidly progressive rash or any new lesions.  Eulas Post MD [facility] at [facility]

## 2018-04-28 DIAGNOSIS — Z6827 Body mass index (BMI) 27.0-27.9, adult: Secondary | ICD-10-CM | POA: Diagnosis not present

## 2018-04-28 DIAGNOSIS — Z01419 Encounter for gynecological examination (general) (routine) without abnormal findings: Secondary | ICD-10-CM | POA: Diagnosis not present

## 2018-06-17 ENCOUNTER — Encounter: Payer: Self-pay | Admitting: Family Medicine

## 2018-06-17 DIAGNOSIS — Z13820 Encounter for screening for osteoporosis: Secondary | ICD-10-CM | POA: Diagnosis not present

## 2018-06-17 DIAGNOSIS — Z780 Asymptomatic menopausal state: Secondary | ICD-10-CM | POA: Diagnosis not present

## 2018-07-02 DIAGNOSIS — D2239 Melanocytic nevi of other parts of face: Secondary | ICD-10-CM | POA: Diagnosis not present

## 2018-07-02 DIAGNOSIS — D2272 Melanocytic nevi of left lower limb, including hip: Secondary | ICD-10-CM | POA: Diagnosis not present

## 2018-07-02 DIAGNOSIS — L821 Other seborrheic keratosis: Secondary | ICD-10-CM | POA: Diagnosis not present

## 2018-07-02 DIAGNOSIS — L570 Actinic keratosis: Secondary | ICD-10-CM | POA: Diagnosis not present

## 2018-07-23 ENCOUNTER — Other Ambulatory Visit: Payer: Self-pay | Admitting: Family Medicine

## 2018-07-23 MED ORDER — ATORVASTATIN CALCIUM 20 MG PO TABS: ORAL_TABLET | ORAL | 1 refills | Status: DC

## 2018-07-23 NOTE — Telephone Encounter (Signed)
Change in pharmacy- remainder of RF sent to new pharamcy Requested Prescriptions  Pending Prescriptions Disp Refills  . atorvastatin (LIPITOR) 20 MG tablet 90 tablet 1    Sig: TAKE 1 TABLET DAILY     Cardiovascular:  Antilipid - Statins Failed - 07/23/2018 10:39 AM      Failed - Triglycerides in normal range and within 360 days    Triglycerides  Date Value Ref Range Status  01/17/2018 157.0 (H) 0.0 - 149.0 mg/dL Final    Comment:    Normal:  <150 mg/dLBorderline High:  150 - 199 mg/dL         Passed - Total Cholesterol in normal range and within 360 days    Cholesterol  Date Value Ref Range Status  01/17/2018 159 0 - 200 mg/dL Final    Comment:    ATP III Classification       Desirable:  < 200 mg/dL               Borderline High:  200 - 239 mg/dL          High:  > = 240 mg/dL         Passed - LDL in normal range and within 360 days    LDL Cholesterol  Date Value Ref Range Status  01/17/2018 77 0 - 99 mg/dL Final         Passed - HDL in normal range and within 360 days    HDL  Date Value Ref Range Status  01/17/2018 51.00 >39.00 mg/dL Final         Passed - Patient is not pregnant      Passed - Valid encounter within last 12 months    Recent Outpatient Visits          3 months ago Skin rash   [facility] at [facility], Alinda Sierras, MD   6 months ago Physical exam   [facility]rap[facility] at [facility], Alinda Sierras, MD   1 year ago Physical exam   Therap[facility] at [facility], Alinda Sierras, MD   2 years ago [place] at [facility], Alinda Sierras, MD   2 years ago Physical exam   Therapist, music at [facility], Alinda Sierras, MD

## 2018-07-23 NOTE — Telephone Encounter (Signed)
Copied from [place] [id number]. Topic: Quick Communication - Rx Refill/Question >> Jul 23, 2018 10:32 AM Yvette Rack wrote: Medication: atorvastatin (LIPITOR) 20 MG tablet  Has the patient contacted their pharmacy? yes   Preferred Pharmacy (with phone number or street name): [facility] [address], [place] - [place] [phone] (Phone) [phone] (Fax)  Agent: Please be advised that RX refills may take up to 3 business days. We ask that you follow-up with your pharmacy.

## 2018-07-30 ENCOUNTER — Encounter: Payer: Self-pay | Admitting: Family Medicine

## 2018-07-30 ENCOUNTER — Other Ambulatory Visit: Payer: Self-pay

## 2018-07-30 ENCOUNTER — Ambulatory Visit (INDEPENDENT_AMBULATORY_CARE_PROVIDER_SITE_OTHER): Payer: Medicare Other | Admitting: Family Medicine

## 2018-07-30 VITALS — BP 122/80 | HR 68 | Temp 98.2°F | Ht 62.0 in | Wt 154.4 lb

## 2018-07-30 DIAGNOSIS — B9789 Other viral agents as the cause of diseases classified elsewhere: Secondary | ICD-10-CM | POA: Diagnosis not present

## 2018-07-30 DIAGNOSIS — J069 Acute upper respiratory infection, unspecified: Secondary | ICD-10-CM

## 2018-07-30 MED ORDER — HYDROCODONE-HOMATROPINE 5-1.5 MG/5ML PO SYRP: 5.0000 mL | ORAL_SOLUTION | Freq: Four times a day (QID) | ORAL | 0 refills | Status: AC | PRN

## 2018-07-30 NOTE — Progress Notes (Signed)
  Subjective:     Patient ID: Heather Adams, female   DOB: [birth year], 68 y.o.   MRN: [id number]  HPI Patient is seen with onset of upper respiratory infection symptoms which started Saturday.  Her husband came back from out of town trip on Thursday with similar symptoms.  She had some sore throat, low-grade fever, nasal congestion, cough.  No body aches.  She felt somewhat better when she first got up this morning and then around 10 AM had some flushing and temperature 99.8.  She took some Tylenol and has felt better since then.  No nausea or vomiting.  No recent travels.  Past Medical History:  Diagnosis Date  . Fainting spell    with blood draws   . Hyperlipidemia   . Osteoarthritis of hip    right   Past Surgical History:  Procedure Laterality Date  . COLONOSCOPY  01-17-12   per Dr. Darlen Round in [place], hemorrhoids, no polyps, repeat in 5 yrs   . endometrosis  1986  . Hemmorriod sugery  04-04-2015  . hisogram  1986  . TONSILLECTOMY  1977  . TOTAL HIP ARTHROPLASTY  [id number]    reports that she has never smoked. She has never used smokeless tobacco. She reports current alcohol use. She reports that she does not use drugs. family history includes Hearing loss in her paternal aunt, paternal grandfather, and paternal uncle; Heart disease (age of onset: 43) in her paternal grandfather; Hyperlipidemia in her mother. Allergies  Allergen Reactions  . Thimerosal      Review of Systems  Constitutional: Positive for fatigue and fever.  HENT: Positive for congestion.   Respiratory: Positive for cough.        Objective:   Physical Exam Constitutional:      Appearance: Normal appearance.  Neck:     Musculoskeletal: Neck supple.  Cardiovascular:     Rate and Rhythm: Normal rate and regular rhythm.  Pulmonary:     Effort: Pulmonary effort is normal.     Breath sounds: Normal breath sounds.  Neurological:     Mental Status: She is alert.        Assessment:      Probable viral URI.  Nonfocal exam.  No respiratory distress.    Plan:     -Recommend plenty of fluids and rest -Did print prescription for Hycodan cough syrup 1 teaspoon every 6 hours for severe cough if not relieved with over-the-counter medications.  She will try some Delsym first. -Follow-up for any persistent fever or other worsening symptoms.  Eulas Post MD [facility] at [facility]

## 2018-07-30 NOTE — Patient Instructions (Signed)
Follow up for any persistent fever or other concerns.

## 2018-12-16 ENCOUNTER — Telehealth: Payer: Self-pay | Admitting: Family Medicine

## 2018-12-16 NOTE — Telephone Encounter (Signed)
Copied from [id number]. Topic: Appointment Scheduling - Scheduling Inquiry for Clinic >> Dec 03, 2018 10:26 AM Virl Axe D wrote: Reason for CRM: Pt called to schedule CPE. No answer on FC line x3. Please return call.

## 2019-01-23 ENCOUNTER — Ambulatory Visit: Payer: Self-pay | Admitting: Family Medicine

## 2019-01-30 ENCOUNTER — Encounter: Payer: Self-pay | Admitting: Family Medicine

## 2019-01-30 ENCOUNTER — Ambulatory Visit (INDEPENDENT_AMBULATORY_CARE_PROVIDER_SITE_OTHER): Payer: Medicare Other | Admitting: Family Medicine

## 2019-01-30 ENCOUNTER — Other Ambulatory Visit: Payer: Self-pay

## 2019-01-30 VITALS — BP 120/70 | HR 77 | Temp 97.9°F | Ht 61.5 in | Wt 151.2 lb

## 2019-01-30 DIAGNOSIS — Z0000 Encounter for general adult medical examination without abnormal findings: Secondary | ICD-10-CM | POA: Diagnosis not present

## 2019-01-30 DIAGNOSIS — Z23 Encounter for immunization: Secondary | ICD-10-CM

## 2019-01-30 DIAGNOSIS — E785 Hyperlipidemia, unspecified: Secondary | ICD-10-CM

## 2019-01-30 LAB — HEPATIC FUNCTION PANEL
ALT: 22 U/L (ref 0–35)
AST: 22 U/L (ref 0–37)
Albumin: 4.6 g/dL (ref 3.5–5.2)
Alkaline Phosphatase: 68 U/L (ref 39–117)
Bilirubin, Direct: 0.1 mg/dL (ref 0.0–0.3)
Total Bilirubin: 0.8 mg/dL (ref 0.2–1.2)
Total Protein: 7.1 g/dL (ref 6.0–8.3)

## 2019-01-30 LAB — LIPID PANEL
Cholesterol: 186 mg/dL (ref 0–200)
HDL: 44.9 mg/dL (ref >39.00)
NonHDL: 141.29
Total CHOL/HDL Ratio: 4
Triglycerides: 243 mg/dL — ABNORMAL HIGH (ref 0.0–149.0)
VLDL: 48.6 mg/dL — ABNORMAL HIGH (ref 0.0–40.0)

## 2019-01-30 LAB — LDL CHOLESTEROL, DIRECT: Direct LDL: 86 mg/dL

## 2019-01-30 MED ORDER — ATORVASTATIN CALCIUM 20 MG PO TABS: ORAL_TABLET | ORAL | 3 refills | Status: DC

## 2019-01-30 NOTE — Patient Instructions (Signed)

## 2019-01-30 NOTE — Progress Notes (Signed)
Subjective:     Patient ID: Heather Adams, female   DOB: December 04, 1950, 68 y.o.   MRN: [id number]  HPI  Patient seen for Medicare wellness and medical follow-up.  She takes atorvastatin for hyperlipidemia.  She still sees gynecologist yearly.  She gets her mammograms through them.  Health maintenance reviewed  -Patient had colonoscopy 2019 with 10-year follow-up- -hepatitis C screen -2018 -DEXA scan 06/17/2018 -Tetanus due 2022 -She plans to get repeat mammogram this year -Needs Pneumovax -Shingles vaccine already given -She plans to get flu vaccine later  Past Medical History:  Diagnosis Date  . Fainting spell    with blood draws   . Hyperlipidemia   . Osteoarthritis of hip    right   Past Surgical History:  Procedure Laterality Date  . COLONOSCOPY  01-17-12   per Dr. Darlen Round in [place], hemorrhoids, no polyps, repeat in 5 yrs   . endometrosis  1986  . Hemmorriod sugery  04-04-2015  . hisogram  1986  . TONSILLECTOMY  1977  . TOTAL HIP ARTHROPLASTY  [id number]    reports that she has never smoked. She has never used smokeless tobacco. She reports current alcohol use. She reports that she does not use drugs. family history includes Hearing loss in her paternal aunt, paternal grandfather, and paternal uncle; Heart disease (age of onset: 80) in her paternal grandfather; Hyperlipidemia in her mother. Allergies  Allergen Reactions  . Thimerosal     1.  Risk factors based on Past Medical , Social, and Family history reviewed and as indicated above with no changes  2.  Limitations in physical activities None.  No recent falls.  Walks for exercise  3.  Depression/mood No active depression or anxiety issues.  PHQ 2=0  4.  Hearing No defiits  5.  ADLs independent in all.  6.  Cognitive function (orientation to time and place, language, writing, speech,memory) no short or long term memory issues.  Language and judgement intact.  7.  Home Safety no issues  8.  Height,  weight, and visual acuity.all stable.  9.  Counseling discussed - discussed age-appropriate vaccines and other indicated health maintenance.  10. Recommendation of preventive services. Set up mammogram, flu vaccine later this Fall  11. Labs based on risk factors-lipid and hepatic  12. Care Plan- as above.  13. Other Providers  Dr Ardis Hughs- GYN  103. Written schedule of screening/prevention services given to patient. Health Maintenance  Topic Date Due  . INFLUENZA VACCINE  01/03/2019  . MAMMOGRAM  03/15/2019  . TETANUS/TDAP  10/01/2020  . COLONOSCOPY  06/12/2027  . DEXA SCAN  Completed  . Hepatitis C Screening  Completed  . PNA vac Low Risk Adult  Completed     Review of Systems  Constitutional: Negative for activity change, appetite change, fatigue, fever and unexpected weight change.  HENT: Negative for ear pain, hearing loss, sore throat and trouble swallowing.   Eyes: Negative for visual disturbance.  Respiratory: Negative for cough and shortness of breath.   Cardiovascular: Negative for chest pain and palpitations.  Gastrointestinal: Negative for abdominal pain, blood in stool, constipation and diarrhea.  Endocrine: Negative for polydipsia and polyuria.  Genitourinary: Negative for dysuria and hematuria.  Musculoskeletal: Negative for arthralgias, back pain and myalgias.  Skin: Negative for rash.  Neurological: Negative for dizziness, syncope and headaches.  Hematological: Negative for adenopathy.  Psychiatric/Behavioral: Negative for confusion and dysphoric mood.       Objective:   Physical Exam Constitutional:  Appearance: She is well-developed.  HENT:     Head: Normocephalic and atraumatic.  Eyes:     Pupils: Pupils are equal, round, and reactive to light.  Neck:     Musculoskeletal: Normal range of motion and neck supple.     Thyroid: No thyromegaly.  Cardiovascular:     Rate and Rhythm: Normal rate and regular rhythm.     Heart sounds: Normal heart  sounds. No murmur.  Pulmonary:     Effort: No respiratory distress.     Breath sounds: Normal breath sounds. No wheezing or rales.  Abdominal:     General: Bowel sounds are normal. There is no distension.     Palpations: Abdomen is soft. There is no mass.     Tenderness: There is no abdominal tenderness. There is no guarding or rebound.  Musculoskeletal: Normal range of motion.  Lymphadenopathy:     Cervical: No cervical adenopathy.  Skin:    Findings: No rash.  Neurological:     Mental Status: She is alert and oriented to person, place, and time.     Cranial Nerves: No cranial nerve deficit.     Deep Tendon Reflexes: Reflexes normal.  Psychiatric:        Behavior: Behavior normal.        Thought Content: Thought content normal.        Judgment: Judgment normal.        Assessment:     #1 Medicare wellness visit-initial annual  #2 hyperlipidemia which is treated with Lipitor    Plan:     -Check lipid and hepatic panel -Continue regular weightbearing exercise -Pneumovax given -She plans to get flu vaccine later this fall Set up repeat mammogram this fall  Eulas Post MD [facility] at [facility]

## 2019-03-03 DIAGNOSIS — Z23 Encounter for immunization: Secondary | ICD-10-CM | POA: Diagnosis not present

## 2019-03-09 ENCOUNTER — Telehealth: Payer: Self-pay | Admitting: Family Medicine

## 2019-03-09 NOTE — Telephone Encounter (Signed)
Per chart notes and documentation pt received Pna 23 vaccine 01/30/19  Plan    -Check lipid and hepatic panel -Continue regular weightbearing exercise -Pneumovax given -She plans to get flu vaccine later this fall Set up repeat mammogram this fall  Eulas Post MD [facility] at [facility]   Pt is now stating she did not receive vaccine. Please advise.

## 2019-03-09 NOTE — Telephone Encounter (Signed)
Patient called in saying that she seen Dr. Elease Hashimoto on 01/30/2019 and never received her pneumonia shot but was charged for the shot. She was wanting to know if she can be refunded or can she make an appointment to come get her pneumonia shot?

## 2019-03-12 NOTE — Telephone Encounter (Signed)
Per chart pt did receive vaccine. The signed vaccine administration form is scanned into the chart along with documentation by the [place].  Spoke with pt and advised all documentation shows she did receive pna vaccine. She states she does not remember, however she does remember going to the lab after her visit. I advised I had no indication she did not receive the vaccine and that her signature indicates approval to administer. She voiced understanding. Nothing further needed.

## 2019-03-12 NOTE — Telephone Encounter (Signed)
Pt calling to find out about her pneumonia shot.  States that she never got it at last OV but was charged for it.  Pt wants to get it now.

## 2019-04-02 ENCOUNTER — Ambulatory Visit: Payer: Medicare Other

## 2019-04-03 DIAGNOSIS — Z1231 Encounter for screening mammogram for malignant neoplasm of breast: Secondary | ICD-10-CM | POA: Diagnosis not present

## 2019-04-08 ENCOUNTER — Ambulatory Visit: Payer: Self-pay

## 2019-04-08 NOTE — Telephone Encounter (Signed)
Pt. Reports she has some elevated BP readings at home. Today it was 170/78, 158/78 and 146/78 after resting. Has a mild headache today. Warm transfer to [facility] in the practice.  Answer Assessment - Initial Assessment Questions 1. BLOOD PRESSURE: "What is the blood pressure?" "Did you take at least two measurements 5 minutes apart?"     170/78    158/78      146/78 2. ONSET: "When did you take your blood pressure?"     This morning 3. HOW: "How did you obtain the blood pressure?" (e.g., visiting nurse, automatic home BP monitor)     Home monitor 4. HISTORY: "Do you have a history of high blood pressure?"     No 5. MEDICATIONS: "Are you taking any medications for blood pressure?" "Have you missed any doses recently?"     Does not take meds 6. OTHER SYMPTOMS: "Do you have any symptoms?" (e.g., headache, chest pain, blurred vision, difficulty breathing, weakness)     Headache 7. PREGNANCY: "Is there any chance you are pregnant?" "When was your last menstrual period?"     No  Protocols used: HIGH BLOOD PRESSURE-A-AH

## 2019-04-08 NOTE — Telephone Encounter (Signed)
Pt has been scheduled.  °

## 2019-04-10 ENCOUNTER — Encounter: Payer: Self-pay | Admitting: Internal Medicine

## 2019-04-10 ENCOUNTER — Ambulatory Visit (INDEPENDENT_AMBULATORY_CARE_PROVIDER_SITE_OTHER): Payer: Medicare Other | Admitting: Internal Medicine

## 2019-04-10 ENCOUNTER — Other Ambulatory Visit: Payer: Self-pay

## 2019-04-10 VITALS — BP 140/80 | HR 75 | Temp 97.8°F | Wt 149.1 lb

## 2019-04-10 DIAGNOSIS — R030 Elevated blood-pressure reading, without diagnosis of hypertension: Secondary | ICD-10-CM

## 2019-04-10 NOTE — Progress Notes (Signed)
Acute Office Visit     CC/Reason for Visit: elevated BP  HPI: Heather Adams is a 68 y.o. female who is coming in today for the above mentioned reasons. For past 3 days has noticed BP elevated in the [id number] range systolic with a high of 123XX123 on one occasion. She is not known to have high BP. She has not had a HA, CP, SOB or blurry vision.   Past Medical/Surgical History: Past Medical History:  Diagnosis Date  . Fainting spell    with blood draws   . Hyperlipidemia   . Osteoarthritis of hip    right    Past Surgical History:  Procedure Laterality Date  . COLONOSCOPY  01-17-12   per Dr. Darlen Round in [place], hemorrhoids, no polyps, repeat in 5 yrs   . endometrosis  1986  . Hemmorriod sugery  04-04-2015  . hisogram  1986  . TONSILLECTOMY  1977  . TOTAL HIP ARTHROPLASTY  [id number]    Social History:  reports that she has never smoked. She has never used smokeless tobacco. She reports current alcohol use. She reports that she does not use drugs.  Allergies: Allergies  Allergen Reactions  . Thimerosal     Family History:  Family History  Problem Relation Age of Onset  . Hyperlipidemia Mother   . Heart disease Paternal Grandfather 25  . Hearing loss Paternal Grandfather   . Hearing loss Paternal Aunt   . Hearing loss Paternal Uncle      Current Outpatient Medications:  .  atorvastatin (LIPITOR) 20 MG tablet, TAKE 1 TABLET DAILY, Disp: 90 tablet, Rfl: 3 .  Calcium Carbonate-Vit D-Min (CALTRATE 600+D PLUS) 600-400 MG-UNIT per tablet, Take 1 tablet by mouth daily.  , Disp: , Rfl:  .  famotidine (PEPCID) 20 MG tablet, Pepcid, Disp: , Rfl:  .  Glucosamine HCl (GLUCOSAMINE PO), Take by mouth., Disp: , Rfl:  .  Glucosamine-Chondroit-Vit C-Mn (GLUCOSAMINE 1500 COMPLEX PO), Glucosamine, Disp: , Rfl:  .  ibuprofen (ADVIL) 800 MG tablet, ibuprofen 800 mg tablet, Disp: , Rfl:   Review of Systems:  Constitutional: Denies fever, chills, diaphoresis, appetite change  and fatigue.  HEENT: Denies photophobia, eye pain, redness, hearing loss, ear pain, congestion, sore throat, rhinorrhea, sneezing, mouth sores, trouble swallowing, neck pain, neck stiffness and tinnitus.   Respiratory: Denies SOB, DOE, cough, chest tightness,  and wheezing.   Cardiovascular: Denies chest pain, palpitations and leg swelling.  Gastrointestinal: Denies nausea, vomiting, abdominal pain, diarrhea, constipation, blood in stool and abdominal distention.  Genitourinary: Denies dysuria, urgency, frequency, hematuria, flank pain and difficulty urinating.  Endocrine: Denies: hot or cold intolerance, sweats, changes in hair or nails, polyuria, polydipsia. Musculoskeletal: Denies myalgias, back pain, joint swelling, arthralgias and gait problem.  Skin: Denies pallor, rash and wound.  Neurological: Denies dizziness, seizures, syncope, weakness, light-headedness, numbness and headaches.  Hematological: Denies adenopathy. Easy bruising, personal or family bleeding history  Psychiatric/Behavioral: Denies suicidal ideation, mood changes, confusion, nervousness, sleep disturbance and agitation    Physical Exam: Vitals:   04/10/19 1125  BP: 140/80  Pulse: 75  Temp: 97.8 F (36.6 C)  TempSrc: Temporal  SpO2: 96%  Weight: 149 lb 1.6 oz (67.6 kg)    Body mass index is 27.72 kg/m.   Constitutional: NAD, calm, comfortable Eyes: PERRL, lids and conjunctivae normal, wears corrective lenses. ENMT: Mucous membranes are moist.  Respiratory: clear to auscultation bilaterally, no wheezing, no crackles. Normal respiratory effort. No accessory muscle use.  Cardiovascular: Regular  rate and rhythm, no murmurs / rubs / gallops. No extremity edema. 2+ pedal pulses.  Neurologic: grossly intact and non-focal Psychiatric: Normal judgment and insight. Alert and oriented x 3. Normal mood.    Impression and Plan:  Elevated BP without diagnosis of hypertension -Advised ambulatory BP monitoring and to  notify us if values are consistently above 130/80. -Not started on antihypertensive therapy today.    Lelon Frohlich, MD [facility] at [facility]

## 2019-05-20 ENCOUNTER — Telehealth (INDEPENDENT_AMBULATORY_CARE_PROVIDER_SITE_OTHER): Payer: Medicare Other | Admitting: Family Medicine

## 2019-05-20 ENCOUNTER — Encounter: Payer: Self-pay | Admitting: Family Medicine

## 2019-05-20 ENCOUNTER — Other Ambulatory Visit: Payer: Self-pay

## 2019-05-20 DIAGNOSIS — I10 Essential (primary) hypertension: Secondary | ICD-10-CM | POA: Diagnosis not present

## 2019-05-20 MED ORDER — AMLODIPINE BESYLATE 5 MG PO TABS: 5.0000 mg | ORAL_TABLET | Freq: Every day | ORAL | 3 refills | Status: DC

## 2019-05-20 NOTE — Progress Notes (Signed)
This visit type was conducted due to national recommendations for restrictions regarding the COVID-19 pandemic in an effort to limit this patient's exposure and mitigate transmission in our community.   Virtual Visit via Telephone Note  I connected with Heather Adams on 05/20/19 at  1:45 PM EST by telephone and verified that I am speaking with the correct person using two identifiers.   I discussed the limitations, risks, security and privacy concerns of performing an evaluation and management service by telephone and the availability of in person appointments. I also discussed with the patient that there may be a patient responsible charge related to this service. The patient expressed understanding and agreed to proceed.  Location patient: home Location provider: work or home office Participants present for the call: patient, provider Patient did not have a visit in the prior 7 days to address this/these issue(s).   History of Present Illness: Heather Adams was seen here recently with borderline elevated blood pressure.  She has been monitoring blood pressure very closely at home since then has had multiple readings up in the [id number] range systolic with diastolics mostly in the [id number].  She has not had any headaches.  No significant dizziness.  No alcohol use.  No regular nonsteroidal use.  Never treated for hypertension previously but strong family history of hypertension  Past Medical History:  Diagnosis Date  . Fainting spell    with blood draws   . Hyperlipidemia   . Osteoarthritis of hip    right   Past Surgical History:  Procedure Laterality Date  . COLONOSCOPY  01-17-12   per Dr. Darlen Round in [place], hemorrhoids, no polyps, repeat in 5 yrs   . endometrosis  1986  . Hemmorriod sugery  04-04-2015  . hisogram  1986  . TONSILLECTOMY  1977  . TOTAL HIP ARTHROPLASTY  [id number]    reports that she has never smoked. She has never used smokeless tobacco. She reports current  alcohol use. She reports that she does not use drugs. family history includes Hearing loss in her paternal aunt, paternal grandfather, and paternal uncle; Heart disease (age of onset: 45) in her paternal grandfather; Hyperlipidemia in her mother. Allergies  Allergen Reactions  . Thimerosal       Observations/Objective: Patient sounds cheerful and well on the phone. I do not appreciate any SOB. Speech and thought processing are grossly intact. Patient reported vitals:  Assessment and Plan:  Hypertension.  Currently untreated.  Her readings vary considerably but she has had fairly consistent readings up over [id number] systolic  -Continue regular exercise and weight control and sodium reduction -Start amlodipine 5 mg daily and set up office follow-up in about 1 month to reassess  Follow Up Instructions:  -1 month   99441 5-10 99442 11-20 99443 21-30 I did not refer this patient for an OV in the next 24 hours for this/these issue(s).  I discussed the assessment and treatment plan with the patient. The patient was provided an opportunity to ask questions and all were answered. The patient agreed with the plan and demonstrated an understanding of the instructions.   The patient was advised to call back or seek an in-person evaluation if the symptoms worsen or if the condition fails to improve as anticipated.  I provided 17 minutes of non-face-to-face time during this encounter.   Carolann Littler, MD

## 2019-06-12 ENCOUNTER — Encounter: Payer: Self-pay | Admitting: Family Medicine

## 2019-06-12 ENCOUNTER — Other Ambulatory Visit: Payer: Self-pay

## 2019-06-12 ENCOUNTER — Ambulatory Visit (INDEPENDENT_AMBULATORY_CARE_PROVIDER_SITE_OTHER): Payer: Medicare Other | Admitting: Family Medicine

## 2019-06-12 VITALS — BP 150/80 | HR 79 | Temp 97.4°F | Ht 61.5 in | Wt 148.5 lb

## 2019-06-12 DIAGNOSIS — I10 Essential (primary) hypertension: Secondary | ICD-10-CM

## 2019-06-12 MED ORDER — LOSARTAN POTASSIUM 50 MG PO TABS: 50.0000 mg | ORAL_TABLET | Freq: Every day | ORAL | 5 refills | Status: DC

## 2019-06-12 NOTE — Patient Instructions (Signed)

## 2019-06-12 NOTE — Progress Notes (Signed)
  Subjective:     Patient ID: Heather Adams, female   DOB: 12/19/1950, 69 y.o.   MRN: [id number]  HPI Heather Adams is seen for follow-up hypertension.  We recently added amlodipine 5 mg daily.  She is tolerating with no side effects.  She has had occasional good readings at home but has had multiple readings over XX123456 systolic still in spite of medication.  Her diastolics have generally been in the 80s.  No headaches.  No dizziness.  No chest pains.  She stays fairly active.  Never treated for hypertension previously.  No peripheral edema.  No headaches.  Past Medical History:  Diagnosis Date  . Fainting spell    with blood draws   . Hyperlipidemia   . Osteoarthritis of hip    right   Past Surgical History:  Procedure Laterality Date  . COLONOSCOPY  01-17-12   per Dr. Darlen Round in [place], hemorrhoids, no polyps, repeat in 5 yrs   . endometrosis  1986  . Hemmorriod sugery  04-04-2015  . hisogram  1986  . TONSILLECTOMY  1977  . TOTAL HIP ARTHROPLASTY  [id number]    reports that she has never smoked. She has never used smokeless tobacco. She reports current alcohol use. She reports that she does not use drugs. family history includes Hearing loss in her paternal aunt, paternal grandfather, and paternal uncle; Heart disease (age of onset: 89) in her paternal grandfather; Hyperlipidemia in her mother. Allergies  Allergen Reactions  . Thimerosal      Review of Systems  Constitutional: Negative for fatigue.  Eyes: Negative for visual disturbance.  Respiratory: Negative for cough, chest tightness, shortness of breath and wheezing.   Cardiovascular: Negative for chest pain, palpitations and leg swelling.  Neurological: Negative for dizziness, seizures, syncope, weakness, light-headedness and headaches.       Objective:   Physical Exam Constitutional:      Appearance: She is well-developed.  Eyes:     Pupils: Pupils are equal, round, and reactive to light.  Neck:     Thyroid: No  thyromegaly.     Vascular: No JVD.  Cardiovascular:     Rate and Rhythm: Normal rate and regular rhythm.     Heart sounds: No gallop.   Pulmonary:     Effort: Pulmonary effort is normal. No respiratory distress.     Breath sounds: Normal breath sounds. No wheezing or rales.  Musculoskeletal:     Cervical back: Neck supple.     Right lower leg: No edema.     Left lower leg: No edema.  Neurological:     Mental Status: She is alert.        Assessment:     Hypertension.  Suboptimal control in spite of recent initiation of amlodipine.  Repeat left arm seated after rest 150/80    Plan:     -Continue nonpharmacologic management with sodium reduction, regular aerobic exercise, weight control -Add losartan 50 mg daily.  We did discuss possible option of titration of amlodipine to 10 mg but explained this may cause increased peripheral edema.  She will continue to monitor blood pressure closely at home and be in touch if not consistently less than 140/90 over the next few weeks -Recommend follow-up within the next couple months and consider repeat basic metabolic panel by then  Eulas Post MD [facility] at [facility]

## 2019-06-17 ENCOUNTER — Ambulatory Visit: Payer: Medicare Other | Admitting: Family Medicine

## 2019-06-24 ENCOUNTER — Other Ambulatory Visit: Payer: Self-pay

## 2019-06-24 ENCOUNTER — Encounter: Payer: Self-pay | Admitting: Family Medicine

## 2019-06-24 ENCOUNTER — Ambulatory Visit (INDEPENDENT_AMBULATORY_CARE_PROVIDER_SITE_OTHER): Payer: Medicare Other | Admitting: Family Medicine

## 2019-06-24 VITALS — BP 118/70 | HR 64 | Temp 98.0°F | Ht 61.5 in | Wt 146.9 lb

## 2019-06-24 DIAGNOSIS — R202 Paresthesia of skin: Secondary | ICD-10-CM | POA: Diagnosis not present

## 2019-06-24 DIAGNOSIS — I10 Essential (primary) hypertension: Secondary | ICD-10-CM

## 2019-06-24 NOTE — Patient Instructions (Signed)
Paresthesia Paresthesia is an abnormal burning or prickling sensation. It is usually felt in the hands, arms, legs, or feet. However, it may occur in any part of the body. Usually, paresthesia is not painful. It may feel like:  Tingling or numbness.  Buzzing.  Itching. Paresthesia may occur without any clear cause, or it may be caused by:  Breathing too quickly (hyperventilation).  Pressure on a nerve.  An underlying medical condition.  Side effects of a medication.  Nutritional deficiencies.  Exposure to toxic chemicals. Most people experience temporary (transient) paresthesia at some time in their lives. For some people, it may be long-lasting (chronic) because of an underlying medical condition. If you have paresthesia that lasts a long time, you may need to be evaluated by your health care provider. Follow these instructions at home: Alcohol use   Do not drink alcohol if: ? Your health care provider tells you not to drink. ? You are pregnant, may be pregnant, or are planning to become pregnant.  If you drink alcohol: ? Limit how much you use to:  0-1 drink a day for women.  0-2 drinks a day for men. ? Be aware of how much alcohol is in your drink. In the [place] one drink equals one 12 oz bottle of beer (355 mL), one 5 oz glass of wine (148 mL), or one 1 oz glass of hard liquor (44 mL). Nutrition   Eat a healthy diet. This includes: ? Eating foods that are high in fiber, such as fresh fruits and vegetables, whole grains, and beans. ? Limiting foods that are high in fat and processed sugars, such as fried or sweet foods. General instructions  Take over-the-counter and prescription medicines only as told by your health care provider.  Do not use any products that contain nicotine or tobacco, such as cigarettes and e-cigarettes. These can keep blood from reaching damaged nerves. If you need help quitting, ask your health care provider.  If you have diabetes, work  closely with your health care provider to keep your blood sugar under control.  If you have numbness in your feet: ? Check every day for signs of injury or infection. Watch for redness, warmth, and swelling. ? Wear padded socks and comfortable shoes. These help protect your feet.  Keep all follow-up visits as told by your health care provider. This is important. Contact a health care provider if you:  Have paresthesia that gets worse or does not go away.  Have a burning or prickling feeling that gets worse when you walk.  Have pain, cramps, or dizziness.  Develop a rash. Get help right away if you:  Feel weak.  Have trouble walking or moving.  Have problems with speech, understanding, or vision.  Feel confused.  Cannot control your bladder or bowel movements.  Have numbness after an injury.  Develop new weakness in an arm or leg.  Faint. Summary  Paresthesia is an abnormal burning or prickling sensation that is usually felt in the hands, arms, legs, or feet. It may also occur in other parts of the body.  Paresthesia may occur without any clear cause, or it may be caused by breathing too quickly (hyperventilation), pressure on a nerve, an underlying medical condition, side effects of a medication, nutritional deficiencies, or exposure to toxic chemicals.  If you have paresthesia that lasts a long time, you may need to be evaluated by your health care provider. This information is not intended to replace advice given to you by   your health care provider. Make sure you discuss any questions you have with your health care provider. Document Revised: 06/16/2018 Document Reviewed: 05/30/2017 Elsevier Patient Education  2020 [employer] Inc[employer]  

## 2019-06-24 NOTE — Progress Notes (Signed)
Subjective:     Patient ID: Heather Adams, female   DOB: [birth year], 69 y.o.   MRN: [id number]  HPI Heather Adams is seen today for some upper extremity symptoms.  Her recent history is that she has hypertension that was poorly controlled.  We recently added losartan 50 mg daily to amlodipine.  Her home blood pressures are much improved.  She has not had any dizziness or orthostatic type symptoms.  She describes over the weekend some intermittent bilateral "tingling "symptoms involving her forearm and arm.  No associated weakness.  No hand or finger involvement.  No neck pain.  She has a rowing machine at home and about 3 weeks ago did start using that.  She does not describe any orthostatic type symptoms.  No lower extremity symptoms.  No night pain in her hands or upper extremities.  No chest pains.  Her blood pressures are much improved by home readings after starting the losartan.  Her initial concern was whether she was having a side effect from the losartan.  Past Medical History:  Diagnosis Date  . Fainting spell    with blood draws   . Hyperlipidemia   . Osteoarthritis of hip    right   Past Surgical History:  Procedure Laterality Date  . COLONOSCOPY  01-17-12   per Dr. Darlen Round in [place], hemorrhoids, no polyps, repeat in 5 yrs   . endometrosis  1986  . Hemmorriod sugery  04-04-2015  . hisogram  1986  . TONSILLECTOMY  1977  . TOTAL HIP ARTHROPLASTY  [id number]    reports that she has never smoked. She has never used smokeless tobacco. She reports current alcohol use. She reports that she does not use drugs. family history includes Hearing loss in her paternal aunt, paternal grandfather, and paternal uncle; Heart disease (age of onset: 41) in her paternal grandfather; Hyperlipidemia in her mother. Allergies  Allergen Reactions  . Thimerosal      Review of Systems  Constitutional: Negative for fatigue and unexpected weight change.  Eyes: Negative for visual disturbance.   Respiratory: Negative for cough, chest tightness, shortness of breath and wheezing.   Cardiovascular: Negative for chest pain, palpitations and leg swelling.  Endocrine: Negative for polydipsia and polyuria.  Neurological: Negative for dizziness, seizures, syncope, weakness, light-headedness and headaches.       Objective:   Physical Exam Vitals reviewed.  Constitutional:      Appearance: Normal appearance.  Cardiovascular:     Rate and Rhythm: Normal rate and regular rhythm.     Pulses: Normal pulses.  Pulmonary:     Effort: Pulmonary effort is normal.     Breath sounds: Normal breath sounds.  Musculoskeletal:     Right lower leg: No edema.     Left lower leg: No edema.  Neurological:     Mental Status: She is alert.     Comments: Full strength upper extremities.  Symmetric reflexes.        Assessment:     #1 hypertension improved with recent addition of losartan as above.  Blood pressures now at goal  #2 intermittent tingling sensation paresthesias upper extremities.  Doubt carpal tunnel.  Question related to her rowing activities.  She does not have any associated weakness    Plan:     -Continue current blood pressure regimen -Watch out for any associated weakness or any progressive numbness or any new symptoms such as neck pain -Reviewed signs and symptoms of carpal tunnel syndrome to look out for.  Eulas Post MD [facility] at [facility]

## 2019-07-08 DIAGNOSIS — D235 Other benign neoplasm of skin of trunk: Secondary | ICD-10-CM | POA: Diagnosis not present

## 2019-07-08 DIAGNOSIS — L570 Actinic keratosis: Secondary | ICD-10-CM | POA: Diagnosis not present

## 2019-07-08 DIAGNOSIS — L821 Other seborrheic keratosis: Secondary | ICD-10-CM | POA: Diagnosis not present

## 2019-08-05 ENCOUNTER — Encounter: Payer: Self-pay | Admitting: Family Medicine

## 2019-08-06 ENCOUNTER — Other Ambulatory Visit: Payer: Self-pay

## 2019-08-07 ENCOUNTER — Ambulatory Visit (INDEPENDENT_AMBULATORY_CARE_PROVIDER_SITE_OTHER): Payer: Medicare Other | Admitting: Family Medicine

## 2019-08-07 ENCOUNTER — Encounter: Payer: Self-pay | Admitting: Family Medicine

## 2019-08-07 ENCOUNTER — Other Ambulatory Visit: Payer: Self-pay

## 2019-08-07 VITALS — BP 132/76 | HR 66 | Temp 98.3°F | Ht 61.5 in | Wt 148.6 lb

## 2019-08-07 DIAGNOSIS — R208 Other disturbances of skin sensation: Secondary | ICD-10-CM | POA: Diagnosis not present

## 2019-08-07 LAB — VITAMIN B12: Vitamin B-12: 391 pg/mL (ref 211–911)

## 2019-08-07 NOTE — Progress Notes (Signed)
Acute Office Visit  Subjective:    Patient ID: Heather Adams, female    DOB: July 17, 1950, 69 y.o.   MRN: [id number]  Chief Complaint  Patient presents with  . Follow-up    Patient has had a dry mouth for months    HPI Patient is in today for dry mouth and a burning sensation of the tongue that started Nov 2020. States her tongue feels "like sandpaper or like I have burnt it." Yesterday she felt sharp shooting pain in the left side of her face and throat which prompted her to make an appointment. She states the mouth symptoms occur daily, have been mild, but with new onset of the left face and throat pain she felt she should make an appointment. She was evaluated by her dentist and had x-rays 1 week ago and was told the exam was normal.  Reports past hx of canker sores, none currently. Denies hx of fever blisters; denies bumps or nodules seen inside mouth.   Past Medical History:  Diagnosis Date  . Fainting spell    with blood draws   . Hyperlipidemia   . Osteoarthritis of hip    right    Past Surgical History:  Procedure Laterality Date  . COLONOSCOPY  01-17-12   per Dr. Darlen Round in [place], hemorrhoids, no polyps, repeat in 5 yrs   . endometrosis  1986  . Hemmorriod sugery  04-04-2015  . hisogram  1986  . TONSILLECTOMY  1977  . TOTAL HIP ARTHROPLASTY  [id number]    Family History  Problem Relation Age of Onset  . Hyperlipidemia Mother   . Heart disease Paternal Grandfather 8  . Hearing loss Paternal Grandfather   . Hearing loss Paternal Aunt   . Hearing loss Paternal Uncle     Social History   Socioeconomic History  . Marital status: Married    Spouse name: Not on file  . Number of children: Not on file  . Years of education: Not on file  . Highest education level: Not on file  Occupational History  . Not on file  Tobacco Use  . Smoking status: Never Smoker  . Smokeless tobacco: Never Used  Substance and Sexual Activity  . Alcohol use: Yes   Comment: rare  . Drug use: No  . Sexual activity: Not on file  Other Topics Concern  . Not on file  Social History Narrative  . Not on file   Social Determinants of Health   Financial Resource Strain:   . Difficulty of Paying Living Expenses: Not on file  Food Insecurity:   . Worried About Charity fundraiser in the Last Year: Not on file  . Ran Out of Food in the Last Year: Not on file  Transportation Needs:   . Lack of Transportation (Medical): Not on file  . Lack of Transportation (Non-Medical): Not on file  Physical Activity:   . Days of Exercise per Week: Not on file  . Minutes of Exercise per Session: Not on file  Stress:   . Feeling of Stress : Not on file  Social Connections:   . Frequency of Communication with Friends and Family: Not on file  . Frequency of Social Gatherings with Friends and Family: Not on file  . Attends Religious Services: Not on file  . Active Member of Clubs or Organizations: Not on file  . Attends Archivist Meetings: Not on file  . Marital Status: Not on file  Intimate Partner Violence:   .  Fear of Current or Ex-Partner: Not on file  . Emotionally Abused: Not on file  . Physically Abused: Not on file  . Sexually Abused: Not on file    Outpatient Medications Prior to Visit  Medication Sig Dispense Refill  . amLODipine (NORVASC) 5 MG tablet Take 1 tablet (5 mg total) by mouth daily. 30 tablet 3  . atorvastatin (LIPITOR) 20 MG tablet TAKE 1 TABLET DAILY 90 tablet 3  . Calcium Carbonate-Vit D-Min (CALTRATE 600+D PLUS) 600-400 MG-UNIT per tablet Take 1 tablet by mouth daily.      . Cetirizine HCl (ZYRTEC ALLERGY) 10 MG CAPS     . famotidine (PEPCID) 20 MG tablet Pepcid    . fluticasone (FLONASE) 50 MCG/ACT nasal spray Place 1 spray into both nostrils as needed for allergies or rhinitis.    . Glucosamine HCl (GLUCOSAMINE PO) Take by mouth.    . Glucosamine-Chondroit-Vit C-Mn (GLUCOSAMINE 1500 COMPLEX PO) Glucosamine    . ibuprofen  (ADVIL) 800 MG tablet ibuprofen 800 mg tablet    . losartan (COZAAR) 50 MG tablet Take 1 tablet (50 mg total) by mouth daily. 30 tablet 5   No facility-administered medications prior to visit.    Allergies  Allergen Reactions  . Thimerosal     Review of Systems  General: Negative for loss of appetite, weight loss, fever, fatigue. HEENT: Positive for mouth burning, occasional feelings of dry mouth, and tongue feels "like sandpaper or like I burned it"; Positive for sinus drainage that is clear or slightly yellow tinged, clears easily. Negative for decreased salivation. Negative for tonsillar pain or exudate. Negative for headache, sinus tenderness. Neck: Negative for pain, decreased ROM. Neuro: Negative for loss of sensation, weakness or sensory deficit. MSK: Negative for loss of strength, ROM. Skin: Negative for rash, erythema, excessive dryness.      Objective:    Physical Exam Constitutional:      Appearance: Normal appearance.  HENT:     Head: Normocephalic and atraumatic.     Nose: Nose normal. No rhinorrhea.     Mouth/Throat:     Lips: Pink.     Mouth: Mucous membranes are moist.     Dentition: Normal dentition. No gingival swelling, dental abscesses or gum lesions.     Tongue: Tongue does not deviate from midline.     Palate: No mass and lesions.     Pharynx: Oropharynx is clear. Uvula midline.     Tonsils: No tonsillar exudate or tonsillar abscesses.  Eyes:     Extraocular Movements: Extraocular movements intact.     Conjunctiva/sclera: Conjunctivae normal.     Pupils: Pupils are equal, round, and reactive to light.  Cardiovascular:     Pulses: Normal pulses.  Pulmonary:     Effort: Pulmonary effort is normal.  Musculoskeletal:        General: Normal range of motion.     Cervical back: Normal range of motion. No tenderness.  Lymphadenopathy:     Cervical: No cervical adenopathy.  Skin:    General: Skin is warm and dry.  Neurological:     General: No focal  deficit present.     Mental Status: She is alert and oriented to person, place, and time.     Cranial Nerves: No cranial nerve deficit.     Sensory: No sensory deficit.     Motor: No weakness.  Psychiatric:        Mood and Affect: Mood normal.        Behavior: Behavior normal.  Thought Content: Thought content normal.        Judgment: Judgment normal.     BP 132/76   Pulse 66   Temp 98.3 F (36.8 C) (Temporal)   Ht 5' 1.5" (1.562 m)   Wt 148 lb 9.6 oz (67.4 kg)   SpO2 97%   BMI 27.62 kg/m  Wt Readings from Last 3 Encounters:  08/07/19 148 lb 9.6 oz (67.4 kg)  06/24/19 146 lb 14.4 oz (66.6 kg)  06/12/19 148 lb 8 oz (67.4 kg)    Health Maintenance Due  Topic Date Due  . MAMMOGRAM  03/15/2019    There are no preventive care reminders to display for this patient.   Lab Results  Component Value Date   TSH 2.27 01/17/2018   Lab Results  Component Value Date   WBC 5.1 01/17/2018   HGB 13.7 01/17/2018   HCT 40.8 01/17/2018   MCV 89.4 01/17/2018   PLT 250.0 01/17/2018   Lab Results  Component Value Date   NA 141 01/17/2018   K 5.1 01/17/2018   CO2 29 01/17/2018   GLUCOSE 90 01/17/2018   BUN 19 01/17/2018   CREATININE 1.00 01/17/2018   BILITOT 0.8 01/30/2019   ALKPHOS 68 01/30/2019   AST 22 01/30/2019   ALT 22 01/30/2019   PROT 7.1 01/30/2019   ALBUMIN 4.6 01/30/2019   CALCIUM 10.1 01/17/2018   GFR 59.05 (L) 01/17/2018   Lab Results  Component Value Date   CHOL 186 01/30/2019   Lab Results  Component Value Date   HDL 44.90 01/30/2019   Lab Results  Component Value Date   LDLCALC 77 01/17/2018   Lab Results  Component Value Date   TRIG 243.0 (H) 01/30/2019   Lab Results  Component Value Date   CHOLHDL 4 01/30/2019   No results found for: HGBA1C     Assessment & Plan:   Problem List Items Addressed This Visit    None     ASSESSMENT: 1. Oral exam normal with mild prominent papillae seen on left side of tongue. No lesions,  abscess, or abnormalities noted to explain symptoms. Discussion of potential foods that exacerbate symptoms and potential of low vit B12 level to cause mouth burning.  PLAN: 1. Will check vitamin B12 level. Patient took a few doses of oral B12 after recommendation from dentist. 2. Discussed foods/products that exacerbate symptoms. Patient links increased symptoms potentially to spicy food. Advised her to monitor high sodium foods, water intake, and to avoid irritants.    No orders of the defined types were placed in this encounter.    Emmaline Life, RN, BSN AGPCNP Student, [employer] [facility]  Above reviewed.  Relatively mild burning mouth symptoms.  No change of toothpaste.  Will check B12 level.  If symptoms continue, need to check to make sure note using toothpaste with sodium lauryl sulfate.  Her left face pain was very fleeting and non-focal exam at this time.  She is instructed to watch for any weakness or progresive/persistent pain.  Eulas Post MD [facility]

## 2019-08-07 NOTE — Patient Instructions (Addendum)
Vitamin B12 Deficiency Vitamin B12 deficiency occurs when the body does not have enough vitamin B12, which is an important vitamin. The body needs this vitamin:  To make red blood cells.  To make DNA. This is the genetic material inside cells.  To help the nerves work properly so they can carry messages from the brain to the body. Vitamin B12 deficiency can cause various health problems, such as a low red blood cell count (anemia) or nerve damage. What are the causes? This condition may be caused by:  Not eating enough foods that contain vitamin B12.  Not having enough stomach acid and digestive fluids to properly absorb vitamin B12 from the food that you eat.  Certain digestive system diseases that make it hard to absorb vitamin B12. These diseases include Crohn's disease, chronic pancreatitis, and cystic fibrosis.  A condition in which the body does not make enough of a protein (intrinsic factor), resulting in too few red blood cells (pernicious anemia).  Having a surgery in which part of the stomach or small intestine is removed.  Taking certain medicines that make it hard for the body to absorb vitamin B12. These medicines include: ? Heartburn medicines (antacids and proton pump inhibitors). ? Certain antibiotic medicines. ? Some medicines that are used to treat diabetes, tuberculosis, gout, or high cholesterol. What increases the risk? The following factors may make you more likely to develop a B12 deficiency:  Being older than age 50.  Eating a vegetarian or vegan diet, especially while you are pregnant.  Eating a poor diet while you are pregnant.  Taking certain medicines.  Having alcoholism. What are the signs or symptoms? In some cases, there are no symptoms of this condition. If the condition leads to anemia or nerve damage, various symptoms can occur, such as:  Weakness.  Fatigue.  Loss of appetite.  Weight loss.  Numbness or tingling in your hands and  feet.  Redness and burning of the tongue.  Confusion or memory problems.  Depression.  Sensory problems, such as color blindness, ringing in the ears, or loss of taste.  Diarrhea or constipation.  Trouble walking. If anemia is severe, symptoms can include:  Shortness of breath.  Dizziness.  Rapid heart rate (tachycardia). How is this diagnosed? This condition may be diagnosed with a blood test to measure the level of vitamin B12 in your blood. You may also have other tests, including:  A group of tests that measure certain characteristics of blood cells (complete blood count, CBC).  A blood test to measure intrinsic factor.  A procedure where a thin tube with a camera on the end is used to look into your stomach or intestines (endoscopy). Other tests may be needed to discover the cause of B12 deficiency. How is this treated? Treatment for this condition depends on the cause. This condition may be treated by:  Changing your eating and drinking habits, such as: ? Eating more foods that contain vitamin B12. ? Drinking less alcohol or no alcohol.  Getting vitamin B12 injections.  Taking vitamin B12 supplements. Your health care provider will tell you which dosage is best for you. Follow these instructions at home: Eating and drinking   Eat lots of healthy foods that contain vitamin B12, including: ? Meats and poultry. This includes beef, pork, chicken, turkey, and organ meats, such as liver. ? Seafood. This includes clams, rainbow trout, salmon, tuna, and haddock. ? Eggs. ? Cereal and dairy products that are fortified. This means that vitamin B12   has been added to the food. Check the label on the package to see if the food is fortified. The items listed above may not be a complete list of recommended foods and beverages. Contact a dietitian for more information. General instructions  Get any injections that are prescribed by your health care provider.  Take  supplements only as told by your health care provider. Follow the directions carefully.  Do not drink alcohol if your health care provider tells you not to. In some cases, you may only be asked to limit alcohol use.  Keep all follow-up visits as told by your health care provider. This is important. Contact a health care provider if:  Your symptoms come back. Get help right away if you:  Develop shortness of breath.  Have a rapid heart rate.  Have chest pain.  Become dizzy or lose consciousness. Summary  Vitamin B12 deficiency occurs when the body does not have enough vitamin B12.  The main causes of vitamin B12 deficiency include dietary deficiency, digestive diseases, pernicious anemia, and having a surgery in which part of the stomach or small intestine is removed.  In some cases, there are no symptoms of this condition. If the condition leads to anemia or nerve damage, various symptoms can occur, such as weakness, shortness of breath, and numbness.  Treatment may include getting vitamin B12 injections or taking vitamin B12 supplements. Eat lots of healthy foods that contain vitamin B12. This information is not intended to replace advice given to you by your health care provider. Make sure you discuss any questions you have with your health care provider. Document Revised: 11/07/2018 Document Reviewed: 01/28/2018 Elsevier Patient Education  2020 Elsevier Inc.  

## 2019-08-09 ENCOUNTER — Encounter: Payer: Self-pay | Admitting: Family Medicine

## 2019-09-11 ENCOUNTER — Other Ambulatory Visit: Payer: Self-pay | Admitting: Family Medicine

## 2019-11-14 DIAGNOSIS — Z20822 Contact with and (suspected) exposure to covid-19: Secondary | ICD-10-CM | POA: Diagnosis not present

## 2019-11-14 DIAGNOSIS — Z03818 Encounter for observation for suspected exposure to other biological agents ruled out: Secondary | ICD-10-CM | POA: Diagnosis not present

## 2019-11-18 ENCOUNTER — Other Ambulatory Visit: Payer: Self-pay

## 2019-11-18 ENCOUNTER — Telehealth (INDEPENDENT_AMBULATORY_CARE_PROVIDER_SITE_OTHER): Payer: Medicare Other | Admitting: Family Medicine

## 2019-11-18 DIAGNOSIS — R05 Cough: Secondary | ICD-10-CM

## 2019-11-18 DIAGNOSIS — R059 Cough, unspecified: Secondary | ICD-10-CM

## 2019-11-18 MED ORDER — DOXYCYCLINE HYCLATE 100 MG PO CAPS: 100.0000 mg | ORAL_CAPSULE | Freq: Two times a day (BID) | ORAL | 0 refills | Status: DC

## 2019-11-18 NOTE — Progress Notes (Signed)
Patient ID: Heather Adams, female   DOB: [birth year], 69 y.o.   MRN: [id number]  This visit type was conducted due to national recommendations for restrictions regarding the COVID-19 pandemic in an effort to limit this patient's exposure and mitigate transmission in our community.   Virtual Visit via Telephone Note  I connected with Heather Adams on 11/18/19 at  8:00 AM EDT by telephone and verified that I am speaking with the correct person using two identifiers.   I discussed the limitations, risks, security and privacy concerns of performing an evaluation and management service by telephone and the availability of in person appointments. I also discussed with the patient that there may be a patient responsible charge related to this service. The patient expressed understanding and agreed to proceed.  Location patient: home Location provider: work or home office Participants present for the call: patient, provider Patient did not have a visit in the prior 7 days to address this/these issue(s).   History of Present Illness:  Heather Adams is a non-smoker who is seen with cough is her main symptom.  She states that little over 2 weeks ago she had low-grade fever around 100 for about 3 days along with cough.  She had some nasal congestion.  She has had previous Covid vaccine.  After 3 days her fever went away.  She went through a phase where she felt she may have had some wheezing.  That seems to be better as well.  However, last Friday she started having low-grade fever again around 100.8 for its high.  No dyspnea.  No hemoptysis.  No nausea or vomiting.  Overall feels stable.  She went Saturday and had Covid test which was negative.  Past Medical History:  Diagnosis Date  . Fainting spell    with blood draws   . Hyperlipidemia   . Osteoarthritis of hip    right   Past Surgical History:  Procedure Laterality Date  . COLONOSCOPY  01-17-12   per Dr. Darlen Round in [place], hemorrhoids, no  polyps, repeat in 5 yrs   . endometrosis  1986  . Hemmorriod sugery  04-04-2015  . hisogram  1986  . TONSILLECTOMY  1977  . TOTAL HIP ARTHROPLASTY  [id number]    reports that she has never smoked. She has never used smokeless tobacco. She reports current alcohol use. She reports that she does not use drugs. family history includes Hearing loss in her paternal aunt, paternal grandfather, and paternal uncle; Heart disease (age of onset: 50) in her paternal grandfather; Hyperlipidemia in her mother. Allergies  Allergen Reactions  . Thimerosal       Observations/Objective: Patient sounds cheerful and well on the phone. I do not appreciate any SOB. Speech and thought processing are grossly intact. Patient reported vitals:  Assessment and Plan:  Cough.  Patient is in no respiratory distress.  Recent Covid test as above negative and she has had Covid vaccine so risk of COVID-19 infection seems extremely unlikely.  Concerning is the fact that she had initial fever followed by interval of no fever and now low-grade fever again.  -We elected to go and cover with doxycycline 100 mg twice daily and if she is not fully afebrile and fully resolving with symptoms by early next week will need follow-up to further assess  Follow Up Instructions:  -As above -Plenty fluids and rest -Go to ER or urgent care for increased shortness of breath or any worsening symptoms   [id number] [id number] [id number] 11-20 [id number] 21-30  I did not refer this patient for an OV in the next 24 hours for this/these issue(s).  I discussed the assessment and treatment plan with the patient. The patient was provided an opportunity to ask questions and all were answered. The patient agreed with the plan and demonstrated an understanding of the instructions.   The patient was advised to call back or seek an in-person evaluation if the symptoms worsen or if the condition fails to improve as anticipated.  I provided 14 minutes of  non-face-to-face time during this encounter.   Carolann Littler, MD

## 2019-12-01 ENCOUNTER — Other Ambulatory Visit: Payer: Self-pay | Admitting: Family Medicine

## 2019-12-14 ENCOUNTER — Other Ambulatory Visit: Payer: Self-pay | Admitting: Family Medicine

## 2020-01-19 ENCOUNTER — Encounter: Payer: Medicare Other | Admitting: Family Medicine

## 2020-02-01 ENCOUNTER — Other Ambulatory Visit: Payer: Self-pay

## 2020-02-02 ENCOUNTER — Ambulatory Visit (INDEPENDENT_AMBULATORY_CARE_PROVIDER_SITE_OTHER): Payer: Medicare Other | Admitting: Family Medicine

## 2020-02-02 ENCOUNTER — Encounter: Payer: Self-pay | Admitting: Family Medicine

## 2020-02-02 VITALS — BP 122/60 | HR 64 | Temp 97.9°F | Ht 61.75 in | Wt 145.0 lb

## 2020-02-02 DIAGNOSIS — I10 Essential (primary) hypertension: Secondary | ICD-10-CM | POA: Diagnosis not present

## 2020-02-02 DIAGNOSIS — Z0000 Encounter for general adult medical examination without abnormal findings: Secondary | ICD-10-CM | POA: Diagnosis not present

## 2020-02-02 DIAGNOSIS — E785 Hyperlipidemia, unspecified: Secondary | ICD-10-CM

## 2020-02-02 MED ORDER — ATORVASTATIN CALCIUM 20 MG PO TABS: ORAL_TABLET | ORAL | 3 refills | Status: DC

## 2020-02-02 MED ORDER — AMLODIPINE BESYLATE 5 MG PO TABS
5.0000 mg | ORAL_TABLET | Freq: Every day | ORAL | 3 refills | Status: DC
Start: 2020-02-02 — End: 2021-02-13

## 2020-02-02 NOTE — Progress Notes (Signed)
Established Patient Office Visit  Subjective:  Patient ID: Heather Adams, female    DOB: Oct 20, 1950  Age: 69 y.o. MRN: [id number]  CC:  Chief Complaint  Patient presents with  . Annual Exam    HPI Heather Adams presents for Medicare subsequent annual wellness visit and medical follow-up.  She has history of GERD, hypertension, hyperlipidemia.  Addition of amlodipine last year to her losartan and blood pressures have been improved since then.  No recent dizziness.  Compliant with medications.  She has hyperlipidemia treated with atorvastatin.  She is due for follow-up labs.  No recent chest pains.  She stays quite active and exercises regularly.   1.  Risk factors based on Past Medical , Social, and Family history reviewed and as indicated below with no changes  2.  Limitations in physical activities None.  No recent falls.  No balance issues.  3.  Depression/mood No active depression or anxiety issues PHQ 2 equals 0  4.  Hearing No deficits  5.  ADLs independent in all.  6.  Cognitive function (orientation to time and place, language, writing, speech,memory) no short or long term memory issues.  Language and judgement intact.  7.  Home Safety no issues  8.  Height, weight, and visual acuity.all stable.  Wt Readings from Last 3 Encounters:  02/02/20 145 lb (65.8 kg)  08/07/19 148 lb 9.6 oz (67.4 kg)  06/24/19 146 lb 14.4 oz (66.6 kg)      9.  Counseling discussed-continue weightbearing exercise and calcium and vitamin D.  Consider follow-up DEXA in 1 year  10. Recommendation of preventive services.  Counseled regarding age and gender appropriate preventative screenings and immunizations.   11. Labs based on risk factors- lipid, BMP, hepatic  12. Care Plan- as below.  13. Other Providers- none  14. Written schedule of screening/prevention services given to patient.  Health Maintenance  Topic Date Due  . INFLUENZA VACCINE  01/03/2020  . TETANUS/TDAP  10/01/2020    . MAMMOGRAM  04/03/2021  . COLONOSCOPY  06/12/2027  . DEXA SCAN  Completed  . COVID-19 Vaccine  Completed  . Hepatitis C Screening  Completed  . PNA vac Low Risk Adult  Completed      Past Medical History:  Diagnosis Date  . Fainting spell    with blood draws   . Hyperlipidemia   . Osteoarthritis of hip    right    Past Surgical History:  Procedure Laterality Date  . COLONOSCOPY  01-17-12   per Dr. Darlen Round in [place], hemorrhoids, no polyps, repeat in 5 yrs   . endometrosis  1986  . Hemmorriod sugery  04-04-2015  . hisogram  1986  . TONSILLECTOMY  1977  . TOTAL HIP ARTHROPLASTY  [id number]    Family History  Problem Relation Age of Onset  . Hyperlipidemia Mother   . Heart disease Paternal Grandfather 51  . Hearing loss Paternal Grandfather   . Hearing loss Paternal Aunt   . Hearing loss Paternal Uncle     Social History   Socioeconomic History  . Marital status: Married    Spouse name: Not on file  . Number of children: Not on file  . Years of education: Not on file  . Highest education level: Not on file  Occupational History  . Not on file  Tobacco Use  . Smoking status: Never Smoker  . Smokeless tobacco: Never Used  Vaping Use  . Vaping Use: Never used  Substance and Sexual Activity  .  Alcohol use: Yes    Comment: rare  . Drug use: No  . Sexual activity: Not on file  Other Topics Concern  . Not on file  Social History Narrative  . Not on file   Social Determinants of Health   Financial Resource Strain:   . Difficulty of Paying Living Expenses: Not on file  Food Insecurity:   . Worried About Charity fundraiser in the Last Year: Not on file  . Ran Out of Food in the Last Year: Not on file  Transportation Needs:   . Lack of Transportation (Medical): Not on file  . Lack of Transportation (Non-Medical): Not on file  Physical Activity:   . Days of Exercise per Week: Not on file  . Minutes of Exercise per Session: Not on file   Stress:   . Feeling of Stress : Not on file  Social Connections:   . Frequency of Communication with Friends and Family: Not on file  . Frequency of Social Gatherings with Friends and Family: Not on file  . Attends Religious Services: Not on file  . Active Member of Clubs or Organizations: Not on file  . Attends Archivist Meetings: Not on file  . Marital Status: Not on file  Intimate Partner Violence:   . Fear of Current or Ex-Partner: Not on file  . Emotionally Abused: Not on file  . Physically Abused: Not on file  . Sexually Abused: Not on file    Outpatient Medications Prior to Visit  Medication Sig Dispense Refill  . Calcium Carbonate-Vit D-Min (CALTRATE 600+D PLUS) 600-400 MG-UNIT per tablet Take 1 tablet by mouth daily.      . Cetirizine HCl (ZYRTEC ALLERGY) 10 MG CAPS     . famotidine (PEPCID) 20 MG tablet Pepcid    . fluticasone (FLONASE) 50 MCG/ACT nasal spray Place 1 spray into both nostrils as needed for allergies or rhinitis.    . Glucosamine-Chondroit-Vit C-Mn (GLUCOSAMINE 1500 COMPLEX PO) Glucosamine    . ibuprofen (ADVIL) 800 MG tablet ibuprofen 800 mg tablet    . losartan (COZAAR) 50 MG tablet TAKE ONE TABLET BY MOUTH DAILY 90 tablet 1  . amLODipine (NORVASC) 5 MG tablet TAKE ONE TABLET BY MOUTH DAILY 30 tablet 1  . atorvastatin (LIPITOR) 20 MG tablet TAKE 1 TABLET DAILY 90 tablet 3  . doxycycline (VIBRAMYCIN) 100 MG capsule Take 1 capsule (100 mg total) by mouth 2 (two) times daily. 20 capsule 0  . Glucosamine HCl (GLUCOSAMINE PO) Take by mouth.     No facility-administered medications prior to visit.    Allergies  Allergen Reactions  . Thimerosal     ROS Review of Systems  Constitutional: Negative for fatigue.  Eyes: Negative for visual disturbance.  Respiratory: Negative for cough, chest tightness, shortness of breath and wheezing.   Cardiovascular: Negative for chest pain, palpitations and leg swelling.  Neurological: Negative for  dizziness, seizures, syncope, weakness, light-headedness and headaches.      Objective:    Physical Exam Constitutional:      Appearance: She is well-developed.  Eyes:     Pupils: Pupils are equal, round, and reactive to light.  Neck:     Thyroid: No thyromegaly.     Vascular: No JVD.  Cardiovascular:     Rate and Rhythm: Normal rate and regular rhythm.     Heart sounds: No gallop.   Pulmonary:     Effort: Pulmonary effort is normal. No respiratory distress.     Breath  sounds: Normal breath sounds. No wheezing or rales.  Musculoskeletal:     Cervical back: Neck supple.  Neurological:     Mental Status: She is alert.     BP 122/60 (BP Location: Left Arm, Patient Position: Sitting, Cuff Size: Normal)   Pulse 64   Temp 97.9 F (36.6 C) (Oral)   Ht 5' 1.75" (1.568 m)   Wt 145 lb (65.8 kg)   SpO2 98%   BMI 26.74 kg/m  Wt Readings from Last 3 Encounters:  02/02/20 145 lb (65.8 kg)  08/07/19 148 lb 9.6 oz (67.4 kg)  06/24/19 146 lb 14.4 oz (66.6 kg)     Health Maintenance Due  Topic Date Due  . INFLUENZA VACCINE  01/03/2020    There are no preventive care reminders to display for this patient.  Lab Results  Component Value Date   TSH 2.27 01/17/2018   Lab Results  Component Value Date   WBC 5.1 01/17/2018   HGB 13.7 01/17/2018   HCT 40.8 01/17/2018   MCV 89.4 01/17/2018   PLT 250.0 01/17/2018   Lab Results  Component Value Date   NA 141 01/17/2018   K 5.1 01/17/2018   CO2 29 01/17/2018   GLUCOSE 90 01/17/2018   BUN 19 01/17/2018   CREATININE 1.00 01/17/2018   BILITOT 0.8 01/30/2019   ALKPHOS 68 01/30/2019   AST 22 01/30/2019   ALT 22 01/30/2019   PROT 7.1 01/30/2019   ALBUMIN 4.6 01/30/2019   CALCIUM 10.1 01/17/2018   GFR 59.05 (L) 01/17/2018   Lab Results  Component Value Date   CHOL 186 01/30/2019   Lab Results  Component Value Date   HDL 44.90 01/30/2019   Lab Results  Component Value Date   LDLCALC 77 01/17/2018   Lab Results   Component Value Date   TRIG 243.0 (H) 01/30/2019   Lab Results  Component Value Date   CHOLHDL 4 01/30/2019   No results found for: HGBA1C    Assessment & Plan:   #1 Medicare subsequent annual wellness visit  -She plans to get high-dose flu vaccine later this fall.  Otherwise her immunizations are up-to-date. -She is scheduled for mammogram later this fall -Colonoscopy and other age-appropriate screenings are up-to-date at this time -Will need tetanus booster next year  #2 hypertension stable and at goal -Refill blood pressure medications for 1 year  #3 hyperlipidemia -Recheck lipid and hepatic panel -Refill Lipitor for 1 year  Meds ordered this encounter  Medications  . atorvastatin (LIPITOR) 20 MG tablet    Sig: TAKE 1 TABLET DAILY    Dispense:  90 tablet    Refill:  3  . amLODipine (NORVASC) 5 MG tablet    Sig: Take 1 tablet (5 mg total) by mouth daily.    Dispense:  90 tablet    Refill:  3    Follow-up: No follow-ups on file.    Carolann Littler, MD

## 2020-02-02 NOTE — Patient Instructions (Signed)
Preventive Care 67 Years and Older, Female Preventive care refers to lifestyle choices and visits with your health care provider that can promote health and wellness. This includes:  A yearly physical exam. This is also called an annual well check.  Regular dental and eye exams.  Immunizations.  Screening for certain conditions.  Healthy lifestyle choices, such as diet and exercise. What can I expect for my preventive care visit? Physical exam Your health care provider will check:  Height and weight. These may be used to calculate body mass index (BMI), which is a measurement that tells if you are at a healthy weight.  Heart rate and blood pressure.  Your skin for abnormal spots. Counseling Your health care provider may ask you questions about:  Alcohol, tobacco, and drug use.  Emotional well-being.  Home and relationship well-being.  Sexual activity.  Eating habits.  History of falls.  Memory and ability to understand (cognition).  Work and work Statistician.  Pregnancy and menstrual history. What immunizations do I need?  Influenza (flu) vaccine  This is recommended every year. Tetanus, diphtheria, and pertussis (Tdap) vaccine  You may need a Td booster every 10 years. Varicella (chickenpox) vaccine  You may need this vaccine if you have not already been vaccinated. Zoster (shingles) vaccine  You may need this after age 67. Pneumococcal conjugate (PCV13) vaccine  One dose is recommended after age 67. Pneumococcal polysaccharide (PPSV23) vaccine  One dose is recommended after age 72. Measles, mumps, and rubella (MMR) vaccine  You may need at least one dose of MMR if you were born in 1957 or later. You may also need a second dose. Meningococcal conjugate (MenACWY) vaccine  You may need this if you have certain conditions. Hepatitis A vaccine  You may need this if you have certain conditions or if you travel or work in places where you may be exposed  to hepatitis A. Hepatitis B vaccine  You may need this if you have certain conditions or if you travel or work in places where you may be exposed to hepatitis B. Haemophilus influenzae type b (Hib) vaccine  You may need this if you have certain conditions. You may receive vaccines as individual doses or as more than one vaccine together in one shot (combination vaccines). Talk with your health care provider about the risks and benefits of combination vaccines. What tests do I need? Blood tests  Lipid and cholesterol levels. These may be checked every 5 years, or more frequently depending on your overall health.  Hepatitis C test.  Hepatitis B test. Screening  Lung cancer screening. You may have this screening every year starting at age 67 if you have a 30-pack-year history of smoking and currently smoke or have quit within the past 15 years.  Colorectal cancer screening. All adults should have this screening starting at age 67 and continuing until age 15. Your health care provider may recommend screening at age 23 if you are at increased risk. You will have tests every 1-10 years, depending on your results and the type of screening test.  Diabetes screening. This is done by checking your blood sugar (glucose) after you have not eaten for a while (fasting). You may have this done every 1-3 years.  Mammogram. This may be done every 1-2 years. Talk with your health care provider about how often you should have regular mammograms.  BRCA-related cancer screening. This may be done if you have a family history of breast, ovarian, tubal, or peritoneal cancers.  Other tests  Sexually transmitted disease (STD) testing.  Bone density scan. This is done to screen for osteoporosis. You may have this done starting at age 67. Follow these instructions at home: Eating and drinking  Eat a diet that includes fresh fruits and vegetables, whole grains, lean protein, and low-fat dairy products. Limit  your intake of foods with high amounts of sugar, saturated fats, and salt.  Take vitamin and mineral supplements as recommended by your health care provider.  Do not drink alcohol if your health care provider tells you not to drink.  If you drink alcohol: ? Limit how much you have to 0-1 drink a day. ? Be aware of how much alcohol is in your drink. In the [place] one drink equals one 12 oz bottle of beer (355 mL), one 5 oz glass of wine (148 mL), or one 1 oz glass of hard liquor (44 mL). Lifestyle  Take daily care of your teeth and gums.  Stay active. Exercise for at least 30 minutes on 5 or more days each week.  Do not use any products that contain nicotine or tobacco, such as cigarettes, e-cigarettes, and chewing tobacco. If you need help quitting, ask your health care provider.  If you are sexually active, practice safe sex. Use a condom or other form of protection in order to prevent STIs (sexually transmitted infections).  Talk with your health care provider about taking a low-dose aspirin or statin. What's next?  Go to your health care provider once a year for a well check visit.  Ask your health care provider how often you should have your eyes and teeth checked.  Stay up to date on all vaccines. This information is not intended to replace advice given to you by your health care provider. Make sure you discuss any questions you have with your health care provider. Document Revised: 05/15/2018 Document Reviewed: 05/15/2018 Elsevier Patient Education  2020 [employer].

## 2020-02-03 LAB — BASIC METABOLIC PANEL
BUN: 12 mg/dL (ref 7–25)
CO2: 24 mmol/L (ref 20–32)
Calcium: 9.6 mg/dL (ref 8.6–10.4)
Chloride: 107 mmol/L (ref 98–110)
Creat: 0.96 mg/dL (ref 0.50–0.99)
Glucose, Bld: 88 mg/dL (ref 65–99)
Potassium: 5.1 mmol/L (ref 3.5–5.3)
Sodium: 142 mmol/L (ref 135–146)

## 2020-02-03 LAB — HEPATIC FUNCTION PANEL
AG Ratio: 2 (calc) (ref 1.0–2.5)
ALT: 24 U/L (ref 6–29)
AST: 23 U/L (ref 10–35)
Albumin: 4.8 g/dL (ref 3.6–5.1)
Alkaline phosphatase (APISO): 68 U/L (ref 37–153)
Bilirubin, Direct: 0.2 mg/dL (ref 0.0–0.2)
Globulin: 2.4 g/dL (calc) (ref 1.9–3.7)
Indirect Bilirubin: 0.6 mg/dL (calc) (ref 0.2–1.2)
Total Bilirubin: 0.8 mg/dL (ref 0.2–1.2)
Total Protein: 7.2 g/dL (ref 6.1–8.1)

## 2020-02-03 LAB — LIPID PANEL
Cholesterol: 160 mg/dL (ref <200)
HDL: 52 mg/dL (ref >=50)
LDL Cholesterol (Calc): 89 mg/dL (calc)
Non-HDL Cholesterol (Calc): 108 mg/dL (calc) (ref <130)
Total CHOL/HDL Ratio: 3.1 (calc) (ref <5.0)
Triglycerides: 93 mg/dL (ref <150)

## 2020-02-24 DIAGNOSIS — Z23 Encounter for immunization: Secondary | ICD-10-CM | POA: Diagnosis not present

## 2020-04-08 DIAGNOSIS — Z1231 Encounter for screening mammogram for malignant neoplasm of breast: Secondary | ICD-10-CM | POA: Diagnosis not present

## 2020-04-08 LAB — HM MAMMOGRAPHY

## 2020-04-09 DIAGNOSIS — Z23 Encounter for immunization: Secondary | ICD-10-CM | POA: Diagnosis not present

## 2020-04-13 ENCOUNTER — Encounter: Payer: Self-pay | Admitting: Family Medicine

## 2020-05-09 DIAGNOSIS — Z01419 Encounter for gynecological examination (general) (routine) without abnormal findings: Secondary | ICD-10-CM | POA: Diagnosis not present

## 2020-05-29 ENCOUNTER — Other Ambulatory Visit: Payer: Self-pay | Admitting: Family Medicine

## 2020-07-11 DIAGNOSIS — D235 Other benign neoplasm of skin of trunk: Secondary | ICD-10-CM | POA: Diagnosis not present

## 2020-07-11 DIAGNOSIS — L821 Other seborrheic keratosis: Secondary | ICD-10-CM | POA: Diagnosis not present

## 2020-07-11 DIAGNOSIS — L578 Other skin changes due to chronic exposure to nonionizing radiation: Secondary | ICD-10-CM | POA: Diagnosis not present

## 2020-07-11 DIAGNOSIS — L570 Actinic keratosis: Secondary | ICD-10-CM | POA: Diagnosis not present

## 2020-07-29 ENCOUNTER — Encounter: Payer: Self-pay | Admitting: Family Medicine

## 2020-07-29 ENCOUNTER — Ambulatory Visit (INDEPENDENT_AMBULATORY_CARE_PROVIDER_SITE_OTHER): Payer: Medicare Other | Admitting: Family Medicine

## 2020-07-29 ENCOUNTER — Other Ambulatory Visit: Payer: Self-pay

## 2020-07-29 VITALS — BP 120/70 | HR 70 | Ht 61.75 in | Wt 148.0 lb

## 2020-07-29 DIAGNOSIS — H04123 Dry eye syndrome of bilateral lacrimal glands: Secondary | ICD-10-CM | POA: Diagnosis not present

## 2020-07-29 DIAGNOSIS — R682 Dry mouth, unspecified: Secondary | ICD-10-CM | POA: Diagnosis not present

## 2020-07-29 NOTE — Progress Notes (Signed)
Established Patient Office Visit  Subjective:  Patient ID: Heather Adams, female    DOB: 1951-03-15  Age: 70 y.o. MRN: [id number]  CC:  Chief Complaint  Patient presents with  . Neck Pain  . dry mouth    HPI Heather Adams presents for dry mouth and dry eye symptoms for over a year.  She has noticed in the past when she eats spicy foods this tend to be worse.  She is tried some gum and things like sour candies which helped temporarily.  She does have dry eyes and has seen ophthalmologist for that.  Never been tested for Sjogren's syndrome.  Denies any significant arthralgias.  She stopped her Zyrtec but that did not make much difference.  She takes losartan and amlodipine for hypertension.  No anticholinergic medications.  No polyuria.  She has also had some nonspecific diffuse neck pains.  She thinks all this may be positional.  She sometimes reads with her neck flexed for long periods.  Denies any recent neck injury.  No radiculitis symptoms.  No upper extremity weakness.  Past Medical History:  Diagnosis Date  . Fainting spell    with blood draws   . Hyperlipidemia   . Osteoarthritis of hip    right    Past Surgical History:  Procedure Laterality Date  . COLONOSCOPY  01-17-12   per Dr. Darlen Round in [place], hemorrhoids, no polyps, repeat in 5 yrs   . endometrosis  1986  . Hemmorriod sugery  04-04-2015  . hisogram  1986  . TONSILLECTOMY  1977  . TOTAL HIP ARTHROPLASTY  [id number]    Family History  Problem Relation Age of Onset  . Hyperlipidemia Mother   . Heart disease Paternal Grandfather 24  . Hearing loss Paternal Grandfather   . Hearing loss Paternal Aunt   . Hearing loss Paternal Uncle     Social History   Socioeconomic History  . Marital status: Married    Spouse name: Not on file  . Number of children: Not on file  . Years of education: Not on file  . Highest education level: Not on file  Occupational History  . Not on file  Tobacco Use  .  Smoking status: Never Smoker  . Smokeless tobacco: Never Used  Vaping Use  . Vaping Use: Never used  Substance and Sexual Activity  . Alcohol use: Yes    Comment: rare  . Drug use: No  . Sexual activity: Not on file  Other Topics Concern  . Not on file  Social History Narrative  . Not on file   Social Determinants of Health   Financial Resource Strain: Not on file  Food Insecurity: Not on file  Transportation Needs: Not on file  Physical Activity: Not on file  Stress: Not on file  Social Connections: Not on file  Intimate Partner Violence: Not on file    Outpatient Medications Prior to Visit  Medication Sig Dispense Refill  . amLODipine (NORVASC) 5 MG tablet Take 1 tablet (5 mg total) by mouth daily. 90 tablet 3  . atorvastatin (LIPITOR) 20 MG tablet TAKE 1 TABLET DAILY 90 tablet 3  . Calcium Carbonate-Vit D-Min (CALTRATE 600+D PLUS) 600-400 MG-UNIT per tablet Take 1 tablet by mouth daily.      . Cetirizine HCl (ZYRTEC ALLERGY) 10 MG CAPS     . famotidine (PEPCID) 20 MG tablet Pepcid    . fluticasone (FLONASE) 50 MCG/ACT nasal spray Place 1 spray into both nostrils as needed for  allergies or rhinitis.    . Glucosamine-Chondroit-Vit C-Mn (GLUCOSAMINE 1500 COMPLEX PO) Glucosamine    . ibuprofen (ADVIL) 800 MG tablet ibuprofen 800 mg tablet    . losartan (COZAAR) 50 MG tablet TAKE ONE TABLET BY MOUTH DAILY 90 tablet 1   No facility-administered medications prior to visit.    Allergies  Allergen Reactions  . Thimerosal     ROS Review of Systems  Constitutional: Negative for chills and fever.  HENT: Negative for sore throat.   Eyes: Negative for pain, discharge and redness.  Respiratory: Negative for shortness of breath.   Cardiovascular: Negative for chest pain.  Musculoskeletal: Positive for neck pain.      Objective:    Physical Exam Vitals reviewed.  Constitutional:      Appearance: Normal appearance.  HENT:     Mouth/Throat:     Mouth: Mucous membranes  are moist.     Pharynx: Oropharynx is clear. No oropharyngeal exudate.  Cardiovascular:     Rate and Rhythm: Normal rate and regular rhythm.  Pulmonary:     Effort: Pulmonary effort is normal.     Breath sounds: Normal breath sounds.  Musculoskeletal:     Right lower leg: No edema.     Left lower leg: No edema.  Neurological:     Mental Status: She is alert.     BP 120/70   Pulse 70   Ht 5' 1.75" (1.568 m)   Wt 148 lb (67.1 kg)   SpO2 99%   BMI 27.29 kg/m  Wt Readings from Last 3 Encounters:  07/29/20 148 lb (67.1 kg)  02/02/20 145 lb (65.8 kg)  08/07/19 148 lb 9.6 oz (67.4 kg)     Health Maintenance Due  Topic Date Due  . COVID-19 Vaccine (3 - Booster for Pfizer series) 02/18/2020    There are no preventive care reminders to display for this patient.  Lab Results  Component Value Date   TSH 2.27 01/17/2018   Lab Results  Component Value Date   WBC 5.1 01/17/2018   HGB 13.7 01/17/2018   HCT 40.8 01/17/2018   MCV 89.4 01/17/2018   PLT 250.0 01/17/2018   Lab Results  Component Value Date   NA 142 02/02/2020   K 5.1 02/02/2020   CO2 24 02/02/2020   GLUCOSE 88 02/02/2020   BUN 12 02/02/2020   CREATININE 0.96 02/02/2020   BILITOT 0.8 02/02/2020   ALKPHOS 68 01/30/2019   AST 23 02/02/2020   ALT 24 02/02/2020   PROT 7.2 02/02/2020   ALBUMIN 4.6 01/30/2019   CALCIUM 9.6 02/02/2020   GFR 59.05 (L) 01/17/2018   Lab Results  Component Value Date   CHOL 160 02/02/2020   Lab Results  Component Value Date   HDL 52 02/02/2020   Lab Results  Component Value Date   LDLCALC 89 02/02/2020   Lab Results  Component Value Date   TRIG 93 02/02/2020   Lab Results  Component Value Date   CHOLHDL 3.1 02/02/2020   No results found for: HGBA1C    Assessment & Plan:   Problem List Items Addressed This Visit   None   Visit Diagnoses    Dry mouth    -  Primary   Relevant Orders   Sjogren's syndrome antibods(ssa + ssb)   Dry eyes       Relevant Orders    Sjogren's syndrome antibods(ssa + ssb)    Check Sjogren antibodies. -Stay well-hydrated -Avoid antihistamines much as possible -Continue sugar-free candy  or sour candies to help as needed  No orders of the defined types were placed in this encounter.   Follow-up: No follow-ups on file.    Carolann Littler, MD

## 2020-08-01 LAB — SJOGREN'S SYNDROME ANTIBODS(SSA + SSB)
SSA (Ro) (ENA) Antibody, IgG: <1 AI
SSB (La) (ENA) Antibody, IgG: <1 AI

## 2020-08-04 ENCOUNTER — Encounter: Payer: Self-pay | Admitting: Family Medicine

## 2020-10-18 DIAGNOSIS — Z23 Encounter for immunization: Secondary | ICD-10-CM | POA: Diagnosis not present

## 2020-11-07 ENCOUNTER — Other Ambulatory Visit: Payer: Self-pay

## 2020-11-08 ENCOUNTER — Encounter: Payer: Self-pay | Admitting: Family Medicine

## 2020-11-08 ENCOUNTER — Ambulatory Visit (INDEPENDENT_AMBULATORY_CARE_PROVIDER_SITE_OTHER): Payer: Medicare Other | Admitting: Family Medicine

## 2020-11-08 VITALS — BP 140/70 | HR 72 | Temp 98.2°F | Wt 150.3 lb

## 2020-11-08 DIAGNOSIS — K219 Gastro-esophageal reflux disease without esophagitis: Secondary | ICD-10-CM

## 2020-11-08 NOTE — Patient Instructions (Signed)
Food Choices for Gastroesophageal Reflux Disease, Adult When you have gastroesophageal reflux disease (GERD), the foods you eat and your eating habits are very important. Choosing the right foods can help ease the discomfort of GERD. Consider working with a dietitian to help you make healthy food choices. What are tips for following this plan? Reading food labels  Look for foods that are low in saturated fat. Foods that have less than 5% of daily value (DV) of fat and 0 g of trans fats may help with your symptoms. Cooking  Cook foods using methods other than frying. This may include baking, steaming, grilling, or broiling. These are all methods that do not need a lot of fat for cooking.  To add flavor, try to use herbs that are low in spice and acidity. Meal planning  Choose healthy foods that are low in fat, such as fruits, vegetables, whole grains, low-fat dairy products, lean meats, fish, and poultry.  Eat frequent, small meals instead of three large meals each day. Eat your meals slowly, in a relaxed setting. Avoid bending over or lying down until 2-3 hours after eating.  Limit high-fat foods such as fatty meats or fried foods.  Limit your intake of fatty foods, such as oils, butter, and shortening.  Avoid the following as told by your health care provider: ? Foods that cause symptoms. These may be different for different people. Keep a food diary to keep track of foods that cause symptoms. ? Alcohol. ? Drinking large amounts of liquid with meals. ? Eating meals during the 2-3 hours before bed.   Lifestyle  Maintain a healthy weight. Ask your health care provider what weight is healthy for you. If you need to lose weight, work with your health care provider to do so safely.  Exercise for at least 30 minutes on 5 or more days each week, or as told by your health care provider.  Avoid wearing clothes that fit tightly around your waist and chest.  Do not use any products that  contain nicotine or tobacco. These products include cigarettes, chewing tobacco, and vaping devices, such as e-cigarettes. If you need help quitting, ask your health care provider.  Sleep with the head of your bed raised. Use a wedge under the mattress or blocks under the bed frame to raise the head of the bed.  Chew sugar-free gum after mealtimes. What foods should I eat? Eat a healthy, well-balanced diet of fruits, vegetables, whole grains, low-fat dairy products, lean meats, fish, and poultry. Each person is different. Foods that may trigger symptoms in one person may not trigger any symptoms in another person. Work with your health care provider to identify foods that are safe for you. The items listed above may not be a complete list of recommended foods and beverages. Contact a dietitian for more information.   What foods should I avoid? Limiting some of these foods may help manage the symptoms of GERD. Everyone is different. Consult a dietitian or your health care provider to help you identify the exact foods to avoid, if any. Fruits Any fruits prepared with added fat. Any fruits that cause symptoms. For some people this may include citrus fruits, such as oranges, grapefruit, pineapple, and lemons. Vegetables Deep-fried vegetables. French fries. Any vegetables prepared with added fat. Any vegetables that cause symptoms. For some people, this may include tomatoes and tomato products, chili peppers, onions and garlic, and horseradish. Grains Pastries or quick breads with added fat. Meats and other proteins High-fat   meats, such as fatty beef or pork, hot dogs, ribs, ham, sausage, salami, and bacon. Fried meat or protein, including fried fish and fried chicken. Nuts and nut butters, in large amounts. Dairy Whole milk and chocolate milk. Sour cream. Cream. Ice cream. Cream cheese. Milkshakes. Fats and oils Butter. Margarine. Shortening. Ghee. Beverages Coffee and tea, with or without  caffeine. Carbonated beverages. Sodas. Energy drinks. Fruit juice made with acidic fruits, such as orange or grapefruit. Tomato juice. Alcoholic drinks. Sweets and desserts Chocolate and cocoa. Donuts. Seasonings and condiments Pepper. Peppermint and spearmint. Added salt. Any condiments, herbs, or seasonings that cause symptoms. For some people, this may include curry, hot sauce, or vinegar-based salad dressings. The items listed above may not be a complete list of foods and beverages to avoid. Contact a dietitian for more information. Questions to ask your health care provider Diet and lifestyle changes are usually the first steps that are taken to manage symptoms of GERD. If diet and lifestyle changes do not improve your symptoms, talk with your health care provider about taking medicines. Where to find more information  International Foundation for Gastrointestinal Disorders: aboutgerd.org Summary  When you have gastroesophageal reflux disease (GERD), food and lifestyle choices may be very helpful in easing the discomfort of GERD.  Eat frequent, small meals instead of three large meals each day. Eat your meals slowly, in a relaxed setting. Avoid bending over or lying down until 2-3 hours after eating.  Limit high-fat foods such as fatty meats or fried foods. This information is not intended to replace advice given to you by your health care provider. Make sure you discuss any questions you have with your health care provider. Document Revised: 11/30/2019 Document Reviewed: 11/30/2019 Elsevier Patient Education  [place].  Consider trial of over the counter Pepcid 20 mg twice daily as needed.

## 2020-11-08 NOTE — Progress Notes (Signed)
Established Patient Office Visit  Subjective:  Patient ID: Heather Adams, female    DOB: 1951-03-22  Age: 70 y.o. MRN: [id number]  CC:  Chief Complaint  Patient presents with  . Gastroesophageal Reflux    Acid reflux, bloating, low back pain, pt states that taking Pepcid helps with all these symptoms    HPI Heather Adams presents for occasional burping and acid reflux symptoms.  She has occasional burning sensation that comes up into her esophagus region.  No dysphagia.  No pain with swallowing.  No nonsteroidal use.  No alcohol use.  Minimal caffeine use.  She has had prompt relief with times and is taking occasional Pepcid.  Denies any appetite changes or unintentional weight loss.  She notices that spicy foods seem to trigger her symptoms especially  Past Medical History:  Diagnosis Date  . Fainting spell    with blood draws   . Hyperlipidemia   . Osteoarthritis of hip    right    Past Surgical History:  Procedure Laterality Date  . COLONOSCOPY  01-17-12   per Dr. Darlen Round in [place], hemorrhoids, no polyps, repeat in 5 yrs   . endometrosis  1986  . Hemmorriod sugery  04-04-2015  . hisogram  1986  . TONSILLECTOMY  1977  . TOTAL HIP ARTHROPLASTY  [id number]    Family History  Problem Relation Age of Onset  . Hyperlipidemia Mother   . Heart disease Paternal Grandfather 17  . Hearing loss Paternal Grandfather   . Hearing loss Paternal Aunt   . Hearing loss Paternal Uncle     Social History   Socioeconomic History  . Marital status: Married    Spouse name: Not on file  . Number of children: Not on file  . Years of education: Not on file  . Highest education level: Not on file  Occupational History  . Not on file  Tobacco Use  . Smoking status: Never Smoker  . Smokeless tobacco: Never Used  Vaping Use  . Vaping Use: Never used  Substance and Sexual Activity  . Alcohol use: Yes    Comment: rare  . Drug use: No  . Sexual activity: Not on file  Other  Topics Concern  . Not on file  Social History Narrative  . Not on file   Social Determinants of Health   Financial Resource Strain: Not on file  Food Insecurity: Not on file  Transportation Needs: Not on file  Physical Activity: Not on file  Stress: Not on file  Social Connections: Not on file  Intimate Partner Violence: Not on file    Outpatient Medications Prior to Visit  Medication Sig Dispense Refill  . amLODipine (NORVASC) 5 MG tablet Take 1 tablet (5 mg total) by mouth daily. 90 tablet 3  . atorvastatin (LIPITOR) 20 MG tablet TAKE 1 TABLET DAILY 90 tablet 3  . Calcium Carbonate-Vit D-Min (CALTRATE 600+D PLUS) 600-400 MG-UNIT per tablet Take 1 tablet by mouth daily.    . Cetirizine HCl (ZYRTEC ALLERGY) 10 MG CAPS     . famotidine (PEPCID) 20 MG tablet Pepcid    . fluticasone (FLONASE) 50 MCG/ACT nasal spray Place 1 spray into both nostrils as needed for allergies or rhinitis.    . Glucosamine-Chondroit-Vit C-Mn (GLUCOSAMINE 1500 COMPLEX PO) Glucosamine    . ibuprofen (ADVIL) 800 MG tablet ibuprofen 800 mg tablet    . losartan (COZAAR) 50 MG tablet TAKE ONE TABLET BY MOUTH DAILY 90 tablet 1   No facility-administered medications  prior to visit.    Allergies  Allergen Reactions  . Thimerosal     ROS Review of Systems  Constitutional: Negative for appetite change and unexpected weight change.  HENT: Negative for trouble swallowing.   Respiratory: Negative for cough and shortness of breath.   Cardiovascular: Negative for chest pain.      Objective:    Physical Exam Vitals reviewed.  Cardiovascular:     Rate and Rhythm: Normal rate and regular rhythm.  Pulmonary:     Effort: Pulmonary effort is normal.     Breath sounds: Normal breath sounds.  Musculoskeletal:     Cervical back: Neck supple.  Lymphadenopathy:     Cervical: No cervical adenopathy.  Neurological:     Mental Status: She is alert.     BP 140/70 (BP Location: Left Arm, Patient Position:  Sitting, Cuff Size: Normal)   Pulse 72   Temp 98.2 F (36.8 C) (Oral)   Wt 150 lb 4.8 oz (68.2 kg)   SpO2 96%   BMI 27.71 kg/m  Wt Readings from Last 3 Encounters:  11/08/20 150 lb 4.8 oz (68.2 kg)  07/29/20 148 lb (67.1 kg)  02/02/20 145 lb (65.8 kg)     Health Maintenance Due  Topic Date Due  . COVID-19 Vaccine (3 - Booster for Pfizer series) 01/18/2020  . TETANUS/TDAP  10/01/2020    There are no preventive care reminders to display for this patient.  Lab Results  Component Value Date   TSH 2.27 01/17/2018   Lab Results  Component Value Date   WBC 5.1 01/17/2018   HGB 13.7 01/17/2018   HCT 40.8 01/17/2018   MCV 89.4 01/17/2018   PLT 250.0 01/17/2018   Lab Results  Component Value Date   NA 142 02/02/2020   K 5.1 02/02/2020   CO2 24 02/02/2020   GLUCOSE 88 02/02/2020   BUN 12 02/02/2020   CREATININE 0.96 02/02/2020   BILITOT 0.8 02/02/2020   ALKPHOS 68 01/30/2019   AST 23 02/02/2020   ALT 24 02/02/2020   PROT 7.2 02/02/2020   ALBUMIN 4.6 01/30/2019   CALCIUM 9.6 02/02/2020   GFR 59.05 (L) 01/17/2018   Lab Results  Component Value Date   CHOL 160 02/02/2020   Lab Results  Component Value Date   HDL 52 02/02/2020   Lab Results  Component Value Date   LDLCALC 89 02/02/2020   Lab Results  Component Value Date   TRIG 93 02/02/2020   Lab Results  Component Value Date   CHOLHDL 3.1 02/02/2020   No results found for: HGBA1C    Assessment & Plan:   GERD symptoms.  She does not have any red flags such as weight loss, loss of appetite, odynophagia, dysphagia  -Dietary modification discussed with handout given -Recommend trial over-the-counter Pepcid 20 mg once or twice daily as needed and she will consider taking this regularly for the next couple weeks to settle down symptoms and then taper off. -Follow-up for any persistent or progressive symptoms  No orders of the defined types were placed in this encounter.   Follow-up: No follow-ups on  file.    Carolann Littler, MD

## 2020-12-04 ENCOUNTER — Other Ambulatory Visit: Payer: Self-pay | Admitting: Family Medicine

## 2021-02-13 ENCOUNTER — Encounter: Payer: Self-pay | Admitting: Family Medicine

## 2021-02-13 ENCOUNTER — Other Ambulatory Visit: Payer: Self-pay

## 2021-02-13 ENCOUNTER — Ambulatory Visit (INDEPENDENT_AMBULATORY_CARE_PROVIDER_SITE_OTHER): Payer: Medicare Other | Admitting: Family Medicine

## 2021-02-13 VITALS — BP 130/72 | HR 85 | Temp 97.8°F | Ht 61.75 in | Wt 150.0 lb

## 2021-02-13 DIAGNOSIS — E785 Hyperlipidemia, unspecified: Secondary | ICD-10-CM | POA: Diagnosis not present

## 2021-02-13 DIAGNOSIS — Z0000 Encounter for general adult medical examination without abnormal findings: Secondary | ICD-10-CM

## 2021-02-13 DIAGNOSIS — I10 Essential (primary) hypertension: Secondary | ICD-10-CM

## 2021-02-13 LAB — BASIC METABOLIC PANEL
BUN: 14 mg/dL (ref 6–23)
CO2: 25 mEq/L (ref 19–32)
Calcium: 10.1 mg/dL (ref 8.4–10.5)
Chloride: 104 mEq/L (ref 96–112)
Creatinine, Ser: 0.98 mg/dL (ref 0.40–1.20)
GFR: 59.28 mL/min — ABNORMAL LOW (ref >60.00)
Glucose, Bld: 95 mg/dL (ref 70–99)
Potassium: 4.9 mEq/L (ref 3.5–5.1)
Sodium: 139 mEq/L (ref 135–145)

## 2021-02-13 LAB — LIPID PANEL
Cholesterol: 186 mg/dL (ref 0–200)
HDL: 50.6 mg/dL
LDL Cholesterol: 106 mg/dL — ABNORMAL HIGH (ref 0–99)
NonHDL: 134.98
Total CHOL/HDL Ratio: 4
Triglycerides: 143 mg/dL (ref 0.0–149.0)
VLDL: 28.6 mg/dL (ref 0.0–40.0)

## 2021-02-13 LAB — HEPATIC FUNCTION PANEL
ALT: 16 U/L (ref 0–35)
AST: 20 U/L (ref 0–37)
Albumin: 4.7 g/dL (ref 3.5–5.2)
Alkaline Phosphatase: 61 U/L (ref 39–117)
Bilirubin, Direct: 0.1 mg/dL (ref 0.0–0.3)
Total Bilirubin: 0.8 mg/dL (ref 0.2–1.2)
Total Protein: 7.5 g/dL (ref 6.0–8.3)

## 2021-02-13 MED ORDER — AMLODIPINE BESYLATE 5 MG PO TABS: 5.0000 mg | ORAL_TABLET | Freq: Every day | ORAL | 3 refills | Status: DC

## 2021-02-13 NOTE — Progress Notes (Signed)
Established Patient Office Visit  Subjective:  Patient ID: Heather Adams, female    DOB: 07/26/1950  Age: 70 y.o. MRN: [id number]  CC:  Chief Complaint  Patient presents with   Annual Exam    HPI Heather Adams presents for annual Medicare wellness visit and medical follow-up.  She has hypertension treated with losartan and amlodipine and takes atorvastatin for hyperlipidemia.  She needs refills of amlodipine today.  Needs follow-up lab work.  She is tolerating her medications without any side effects.  Health maintenance reviewed and as follows  Health Maintenance  Topic Date Due   COVID-19 Vaccine (3 - Booster for Pfizer series) 01/18/2020   TETANUS/TDAP  10/01/2020   INFLUENZA VACCINE  04/19/2021 (Originally 01/02/2021)   MAMMOGRAM  04/08/2022   COLONOSCOPY (Pts 45-27yr Insurance coverage will need to be confirmed)  06/12/2027   DEXA SCAN  Completed   Hepatitis C Screening  Completed   PNA vac Low Risk Adult  Completed   Zoster Vaccines- Shingrix  Completed   HPV VACCINES  Aged Out   She plans to get flu vaccine later this fall and also plans to get COVID bivalent vaccine when available.  Other vaccines up-to-date.  She gets yearly mammogram and plans to set that up around November.  Family history reviewed and we added that her father died around 914of unknown cause.  Possible vasculitis of some type but she is not sure.  Mom died just shy of her 963thbirthday for congestive heart failure complications.  She had hypertension.  No known CAD.  Social history reviewed and unchanged  1.  Risk factors based on Past Medical , Social, and Family history reviewed and as indicated below with no changes   2.  Limitations in physical activities None.  No recent falls.  No balance issues.  Stays very active and exercises regularly.   3.  Depression/mood No active depression or anxiety issues PHQ 2 equals 0   4.  Hearing No deficits   5.  ADLs independent in all.   6.  Cognitive  function (orientation to time and place, language, writing, speech,memory) no short or long term memory issues.  Language and judgement intact.   7.  Home Safety no issues   8.  Height, weight, and visual acuity.all stable.   Wt Readings from Last 3 Encounters:  02/13/21 150 lb (68 kg)  11/08/20 150 lb 4.8 oz (68.2 kg)  07/29/20 148 lb (67.1 kg)        9.  Counseling discussed-continue weightbearing exercise and calcium and vitamin D.  Consider follow-up DEXA in 1 year   10. Recommendation of preventive services.  Counseled regarding age and gender appropriate preventative screenings and immunizations.  She plans to get her flu vaccine later this fall.     11. Labs based on risk factors- lipid, BMP, hepatic   12. Care Plan- as below.   13. Other Providers- none   14. Written schedule of screening/prevention services given to patient   Past Medical History:  Diagnosis Date   Fainting spell    with blood draws    Hyperlipidemia    Osteoarthritis of hip    right    Past Surgical History:  Procedure Laterality Date   COLONOSCOPY  01-17-12   [place], repeat in 5 yrs    endometrosis  1986   Hemmorriod sugery  04-04-2015   [facility]  TOTAL HIP ARTHROPLASTY  [id number]    Family History  Problem Relation Age of Onset   Hypertension Mother    Heart disease Mother 26       CHF   Hyperlipidemia Mother    Mental retardation Sister    Hearing loss Paternal Aunt    Hearing loss Paternal Uncle    Heart disease Paternal Grandfather 45   Hearing loss Paternal Grandfather     Social History   Socioeconomic History   Marital status: Married    Spouse name: Not on file   Number of children: Not on file   Years of education: Not on file   Highest education level: Not on file  Occupational History   Not on file  Tobacco Use   Smoking status: Never   Smokeless tobacco: Never  Vaping Use    Vaping Use: Never used  Substance and Sexual Activity   Alcohol use: Yes    Comment: rare   Drug use: No   Sexual activity: Not on file  Other Topics Concern   Not on file  Social History Narrative   Not on file   Social Determinants of Health   Financial Resource Strain: Not on file  Food Insecurity: Not on file  Transportation Needs: Not on file  Physical Activity: Not on file  Stress: Not on file  Social Connections: Not on file  Intimate Partner Violence: Not on file    Outpatient Medications Prior to Visit  Medication Sig Dispense Refill   atorvastatin (LIPITOR) 20 MG tablet TAKE 1 TABLET DAILY 90 tablet 3   Calcium Carbonate-Vit D-Min (CALTRATE 600+D PLUS) 600-400 MG-UNIT per tablet Take 1 tablet by mouth daily.     Cetirizine HCl (ZYRTEC ALLERGY) 10 MG CAPS      famotidine (PEPCID) 20 MG tablet Pepcid     fluticasone (FLONASE) 50 MCG/ACT nasal spray Place 1 spray into both nostrils as needed for allergies or rhinitis.     Glucosamine-Chondroit-Vit C-Mn (GLUCOSAMINE 1500 COMPLEX PO) Glucosamine     ibuprofen (ADVIL) 800 MG tablet ibuprofen 800 mg tablet     losartan (COZAAR) 50 MG tablet TAKE ONE TABLET BY MOUTH DAILY 90 tablet 0   amLODipine (NORVASC) 5 MG tablet Take 1 tablet (5 mg total) by mouth daily. 90 tablet 3   No facility-administered medications prior to visit.    Allergies  Allergen Reactions   Thimerosal     ROS Review of Systems  Constitutional:  Negative for fatigue.  Eyes:  Negative for visual disturbance.  Respiratory:  Negative for cough, chest tightness, shortness of breath and wheezing.   Cardiovascular:  Negative for chest pain, palpitations and leg swelling.  Gastrointestinal:  Negative for abdominal pain.  Endocrine: Negative for polydipsia and polyuria.  Genitourinary:  Negative for dysuria.  Neurological:  Negative for dizziness, seizures, syncope, weakness, light-headedness and headaches.     Objective:    Physical  Exam Constitutional:      Appearance: She is well-developed.  Eyes:     Pupils: Pupils are equal, round, and reactive to light.  Neck:     Thyroid: No thyromegaly.     Vascular: No JVD.  Cardiovascular:     Rate and Rhythm: Normal rate and regular rhythm.     Heart sounds:    No gallop.  Pulmonary:     Effort: Pulmonary effort is normal. No respiratory distress.     Breath sounds: Normal breath sounds. No wheezing or rales.  Musculoskeletal:  Cervical back: Neck supple.     Right lower leg: No edema.     Left lower leg: No edema.  Neurological:     General: No focal deficit present.     Mental Status: She is alert.    BP 130/72 (BP Location: Left Arm, Patient Position: Sitting, Cuff Size: Normal)   Pulse 85   Temp 97.8 F (36.6 C) (Oral)   Ht 5' 1.75" (1.568 m)   Wt 150 lb (68 kg)   SpO2 98%   BMI 27.66 kg/m  Wt Readings from Last 3 Encounters:  02/13/21 150 lb (68 kg)  11/08/20 150 lb 4.8 oz (68.2 kg)  07/29/20 148 lb (67.1 kg)     Health Maintenance Due  Topic Date Due   COVID-19 Vaccine (3 - Booster for Pfizer series) 01/18/2020   TETANUS/TDAP  10/01/2020    There are no preventive care reminders to display for this patient.  Lab Results  Component Value Date   TSH 2.27 01/17/2018   Lab Results  Component Value Date   WBC 5.1 01/17/2018   HGB 13.7 01/17/2018   HCT 40.8 01/17/2018   MCV 89.4 01/17/2018   PLT 250.0 01/17/2018   Lab Results  Component Value Date   NA 142 02/02/2020   K 5.1 02/02/2020   CO2 24 02/02/2020   GLUCOSE 88 02/02/2020   BUN 12 02/02/2020   CREATININE 0.96 02/02/2020   BILITOT 0.8 02/02/2020   ALKPHOS 68 01/30/2019   AST 23 02/02/2020   ALT 24 02/02/2020   PROT 7.2 02/02/2020   ALBUMIN 4.6 01/30/2019   CALCIUM 9.6 02/02/2020   GFR 59.05 (L) 01/17/2018   Lab Results  Component Value Date   CHOL 160 02/02/2020   Lab Results  Component Value Date   HDL 52 02/02/2020   Lab Results  Component Value Date    LDLCALC 89 02/02/2020   Lab Results  Component Value Date   TRIG 93 02/02/2020   Lab Results  Component Value Date   CHOLHDL 3.1 02/02/2020   No results found for: HGBA1C    Assessment & Plan:   #1 Medicare subsequent annual wellness visit.  We addressed the following health maintenance issues  -Recommend annual flu vaccine but she declines.  She prefers to get later this fall and she will get at local pharmacy. -We discussed by Valent COVID-vaccine which she plans to get later this fall as well.  Other immunizations up-to-date. -She plans to schedule annual mammogram -Consider repeat DEXA in 1 year  #2 hypertension stable and at goal  -Continue amlodipine and losartan.  Refilled amlodipine for 1 year.  Check basic metabolic panel.  #3 dyslipidemia treated with atorvastatin -Recheck lipid and hepatic panel   Meds ordered this encounter  Medications   amLODipine (NORVASC) 5 MG tablet    Sig: Take 1 tablet (5 mg total) by mouth daily.    Dispense:  90 tablet    Refill:  3    Follow-up: No follow-ups on file.    Carolann Littler, MD

## 2021-02-13 NOTE — Patient Instructions (Signed)
Health Maintenance  Topic Date Due   COVID-19 Vaccine (3 - Booster for Pfizer series) 01/18/2020   TETANUS/TDAP  10/01/2020   INFLUENZA VACCINE  04/19/2021 (Originally 01/02/2021)   MAMMOGRAM  04/08/2022   COLONOSCOPY (Pts 45-2yr Insurance coverage will need to be confirmed)  06/12/2027   DEXA SCAN  Completed   Hepatitis C Screening  Completed   PNA vac Low Risk Adult  Completed   Zoster Vaccines- Shingrix  Completed   HPV VACCINES  Aged Out

## 2021-03-03 DIAGNOSIS — Z23 Encounter for immunization: Secondary | ICD-10-CM | POA: Diagnosis not present

## 2021-03-06 ENCOUNTER — Other Ambulatory Visit: Payer: Self-pay | Admitting: Family Medicine

## 2021-03-06 DIAGNOSIS — E785 Hyperlipidemia, unspecified: Secondary | ICD-10-CM

## 2021-03-30 DIAGNOSIS — Z23 Encounter for immunization: Secondary | ICD-10-CM | POA: Diagnosis not present

## 2021-04-14 DIAGNOSIS — Z1231 Encounter for screening mammogram for malignant neoplasm of breast: Secondary | ICD-10-CM | POA: Diagnosis not present

## 2021-05-10 DIAGNOSIS — Z01419 Encounter for gynecological examination (general) (routine) without abnormal findings: Secondary | ICD-10-CM | POA: Diagnosis not present

## 2021-05-10 DIAGNOSIS — N841 Polyp of cervix uteri: Secondary | ICD-10-CM | POA: Diagnosis not present

## 2021-06-11 ENCOUNTER — Other Ambulatory Visit: Payer: Self-pay | Admitting: Family Medicine

## 2021-07-12 DIAGNOSIS — L821 Other seborrheic keratosis: Secondary | ICD-10-CM | POA: Diagnosis not present

## 2021-07-12 DIAGNOSIS — D235 Other benign neoplasm of skin of trunk: Secondary | ICD-10-CM | POA: Diagnosis not present

## 2021-07-12 DIAGNOSIS — L570 Actinic keratosis: Secondary | ICD-10-CM | POA: Diagnosis not present

## 2021-07-12 DIAGNOSIS — L218 Other seborrheic dermatitis: Secondary | ICD-10-CM | POA: Diagnosis not present

## 2021-09-07 ENCOUNTER — Other Ambulatory Visit: Payer: Self-pay | Admitting: Family Medicine

## 2021-12-04 ENCOUNTER — Other Ambulatory Visit: Payer: Self-pay | Admitting: Family Medicine

## 2022-02-04 ENCOUNTER — Other Ambulatory Visit: Payer: Self-pay | Admitting: Family Medicine

## 2022-02-13 ENCOUNTER — Telehealth: Payer: Self-pay | Admitting: Family Medicine

## 2022-02-13 NOTE — Telephone Encounter (Signed)
Left message for patient to call back and schedule Medicare Annual Wellness Visit (AWV) either virtually or in office. Left  my Herbie Drape number [phone]   Last AWV ;02/13/21  please schedule at anytime with [facility] Nurse Health Advisor 1 or 2

## 2022-02-15 ENCOUNTER — Ambulatory Visit (INDEPENDENT_AMBULATORY_CARE_PROVIDER_SITE_OTHER): Payer: Medicare Other

## 2022-02-15 VITALS — Ht 62.0 in | Wt 145.0 lb

## 2022-02-15 DIAGNOSIS — Z0000 Encounter for general adult medical examination without abnormal findings: Secondary | ICD-10-CM

## 2022-02-15 NOTE — Patient Instructions (Addendum)
Heather Adams , Thank you for taking time to come for your Medicare Wellness Visit. I appreciate your ongoing commitment to your health goals. Please review the following plan we discussed and let me know if I can assist you in the future.   Screening recommendations/referrals: Colonoscopy: Done 06/11/17 Repeat 10 Yrs Mammogram: Done 04/08/20 Repeat 2 Yrs Bone Density: Done Recommended yearly ophthalmology/optometry visit for glaucoma screening and checkup Recommended yearly dental visit for hygiene and checkup  Vaccinations: Influenza vaccine: Up to date Pneumococcal vaccine: Up to date Tdap vaccine: Deferred Shingles vaccine: Done   Covid-19:Done  Advanced directives: Please bring a copy of your health care power of attorney and living will to the office to be added to your chart at your convenience.   Conditions/risks identified: None  Next appointment: Follow up in one year for your annual wellness visit     Preventive Care 65 Years and Older, Female Preventive care refers to lifestyle choices and visits with your health care provider that can promote health and wellness. What does preventive care include? A yearly physical exam. This is also called an annual well check. Dental exams once or twice a year. Routine eye exams. Ask your health care provider how often you should have your eyes checked. Personal lifestyle choices, including: Daily care of your teeth and gums. Regular physical activity. Eating a healthy diet. Avoiding tobacco and drug use. Limiting alcohol use. Practicing safe sex. Taking low-dose aspirin every day. Taking vitamin and mineral supplements as recommended by your health care provider. What happens during an annual well check? The services and screenings done by your health care provider during your annual well check will depend on your age, overall health, lifestyle risk factors, and family history of disease. Counseling  Your health care provider may  ask you questions about your: Alcohol use. Tobacco use. Drug use. Emotional well-being. Home and relationship well-being. Sexual activity. Eating habits. History of falls. Memory and ability to understand (cognition). Work and work Statistician. Reproductive health. Screening  You may have the following tests or measurements: Height, weight, and BMI. Blood pressure. Lipid and cholesterol levels. These may be checked every 5 years, or more frequently if you are over 42 years old. Skin check. Lung cancer screening. You may have this screening every year starting at age 65 if you have a 30-pack-year history of smoking and currently smoke or have quit within the past 15 years. Fecal occult blood test (FOBT) of the stool. You may have this test every year starting at age 27. Flexible sigmoidoscopy or colonoscopy. You may have a sigmoidoscopy every 5 years or a colonoscopy every 10 years starting at age 50. Hepatitis C blood test. Hepatitis B blood test. Sexually transmitted disease (STD) testing. Diabetes screening. This is done by checking your blood sugar (glucose) after you have not eaten for a while (fasting). You may have this done every 1-3 years. Bone density scan. This is done to screen for osteoporosis. You may have this done starting at age 21. Mammogram. This may be done every 1-2 years. Talk to your health care provider about how often you should have regular mammograms. Talk with your health care provider about your test results, treatment options, and if necessary, the need for more tests. Vaccines  Your health care provider may recommend certain vaccines, such as: Influenza vaccine. This is recommended every year. Tetanus, diphtheria, and acellular pertussis (Tdap, Td) vaccine. You may need a Td booster every 10 years. Zoster vaccine. You may need  this after age 13. Pneumococcal 13-valent conjugate (PCV13) vaccine. One dose is recommended after age 70. Pneumococcal  polysaccharide (PPSV23) vaccine. One dose is recommended after age 4. Talk to your health care provider about which screenings and vaccines you need and how often you need them. This information is not intended to replace advice given to you by your health care provider. Make sure you discuss any questions you have with your health care provider. Document Released: 06/17/2015 Document Revised: 02/08/2016 Document Reviewed: 03/22/2015 Elsevier Interactive Patient Education  2017 [employer] Prevention in the Home Falls can cause injuries. They can happen to people of all ages. There are many things you can do to make your home safe and to help prevent falls. What can I do on the outside of my home? Regularly fix the edges of walkways and driveways and fix any cracks. Remove anything that might make you trip as you walk through a door, such as a raised step or threshold. Trim any bushes or trees on the path to your home. Use bright outdoor lighting. Clear any walking paths of anything that might make someone trip, such as rocks or tools. Regularly check to see if handrails are loose or broken. Make sure that both sides of any steps have handrails. Any raised decks and porches should have guardrails on the edges. Have any leaves, snow, or ice cleared regularly. Use sand or salt on walking paths during winter. Clean up any spills in your garage right away. This includes oil or grease spills. What can I do in the bathroom? Use night lights. Install grab bars by the toilet and in the tub and shower. Do not use towel bars as grab bars. Use non-skid mats or decals in the tub or shower. If you need to sit down in the shower, use a plastic, non-slip stool. Keep the floor dry. Clean up any water that spills on the floor as soon as it happens. Remove soap buildup in the tub or shower regularly. Attach bath mats securely with double-sided non-slip rug tape. Do not have throw rugs and other  things on the floor that can make you trip. What can I do in the bedroom? Use night lights. Make sure that you have a light by your bed that is easy to reach. Do not use any sheets or blankets that are too big for your bed. They should not hang down onto the floor. Have a firm chair that has side arms. You can use this for support while you get dressed. Do not have throw rugs and other things on the floor that can make you trip. What can I do in the kitchen? Clean up any spills right away. Avoid walking on wet floors. Keep items that you use a lot in easy-to-reach places. If you need to reach something above you, use a strong step stool that has a grab bar. Keep electrical cords out of the way. Do not use floor polish or wax that makes floors slippery. If you must use wax, use non-skid floor wax. Do not have throw rugs and other things on the floor that can make you trip. What can I do with my stairs? Do not leave any items on the stairs. Make sure that there are handrails on both sides of the stairs and use them. Fix handrails that are broken or loose. Make sure that handrails are as long as the stairways. Check any carpeting to make sure that it is firmly attached to  the stairs. Fix any carpet that is loose or worn. Avoid having throw rugs at the top or bottom of the stairs. If you do have throw rugs, attach them to the floor with carpet tape. Make sure that you have a light switch at the top of the stairs and the bottom of the stairs. If you do not have them, ask someone to add them for you. What else can I do to help prevent falls? Wear shoes that: Do not have high heels. Have rubber bottoms. Are comfortable and fit you well. Are closed at the toe. Do not wear sandals. If you use a stepladder: Make sure that it is fully opened. Do not climb a closed stepladder. Make sure that both sides of the stepladder are locked into place. Ask someone to hold it for you, if possible. Clearly  mark and make sure that you can see: Any grab bars or handrails. First and last steps. Where the edge of each step is. Use tools that help you move around (mobility aids) if they are needed. These include: Canes. Walkers. Scooters. Crutches. Turn on the lights when you go into a dark area. Replace any light bulbs as soon as they burn out. Set up your furniture so you have a clear path. Avoid moving your furniture around. If any of your floors are uneven, fix them. If there are any pets around you, be aware of where they are. Review your medicines with your doctor. Some medicines can make you feel dizzy. This can increase your chance of falling. Ask your doctor what other things that you can do to help prevent falls. This information is not intended to replace advice given to you by your health care provider. Make sure you discuss any questions you have with your health care provider. Document Released: 03/17/2009 Document Revised: 10/27/2015 Document Reviewed: 06/25/2014 Elsevier Interactive Patient Education  2017 [employer].

## 2022-02-15 NOTE — Progress Notes (Addendum)
Subjective:   Heather Adams is a 71 y.o. female who presents for Medicare Annual (Subsequent) preventive examination.  Review of Systems    Virtual Visit via Telephone Note  I connected with  [name] on 02/15/22 at  8:30 AM EDT by telephone and verified that I am speaking with the correct person using two identifiers.  Location: Patient: Home Provider: Office Persons participating in the virtual visit: patient/Nurse Health Advisor   I discussed the limitations, risks, security and privacy concerns of performing an evaluation and management service by telephone and the availability of in person appointments. The patient expressed understanding and agreed to proceed.  Interactive audio and video telecommunications were attempted between this nurse and patient, however failed, due to patient having technical difficulties OR patient did not have access to video capability.  We continued and completed visit with audio only.  Some vital signs may be absent or patient reported.   Heather Peaches, LPN  Cardiac Risk Factors include: advanced age (>53mn, >>58women);hypertension     Objective:    Today's Vitals   02/15/22 0831  Weight: 145 lb (65.8 kg)  Height: '5\' 2"'$  (1.575 m)   Body mass index is 26.52 kg/m.     02/15/2022    8:37 AM  Advanced Directives  Does Patient Have a Medical Advance Directive? Yes  Type of AParamedicof A[place]wayLiving will  Copy of HMacedoniain Chart? No - copy requested    Current Medications (verified) Outpatient Encounter Medications as of 02/15/2022  Medication Sig   amLODipine (NORVASC) 5 MG tablet TAKE ONE TABLET BY MOUTH DAILY   atorvastatin (LIPITOR) 20 MG tablet TAKE ONE TABLET BY MOUTH DAILY   Calcium Carbonate-Vit D-Min (CALTRATE 600+D PLUS) 600-400 MG-UNIT per tablet Take 1 tablet by mouth daily.   Cetirizine HCl (ZYRTEC ALLERGY) 10 MG CAPS    famotidine (PEPCID) 20 MG tablet Pepcid    fluticasone (FLONASE) 50 MCG/ACT nasal spray Place 1 spray into both nostrils as needed for allergies or rhinitis.   Glucosamine-Chondroit-Vit C-Mn (GLUCOSAMINE 1500 COMPLEX PO) Glucosamine   ibuprofen (ADVIL) 800 MG tablet ibuprofen 800 mg tablet   losartan (COZAAR) 50 MG tablet TAKE ONE TABLET BY MOUTH DAILY   No facility-administered encounter medications on file as of 02/15/2022.    Allergies (verified) Thimerosal   History: Past Medical History:  Diagnosis Date   Fainting spell    with blood draws    Hyperlipidemia    Osteoarthritis of hip    right   Past Surgical History:  Procedure Laterality Date   COLONOSCOPY  01-17-12   per Dr. SDarlen Roundin [place] hemorrhoids, no polyps, repeat in 5 yrs    endometrosis  1986   Hemmorriod sugery  04-04-2015   [place]ogram  [place] 2010[place]2011   Family History  Problem Relation Age of Onset   Hypertension Mother    Heart disease Mother [phone]      CHF   Hyperlipidemia Mother    Mental retardation Sister    Hearing loss Paternal Aunt    Hearing loss Paternal Uncle    Heart disease Paternal Grandfather [phone]  Hearing loss Paternal Grandfather    Social History   Socioeconomic History   Marital status: Married    Spouse name: Not on file   Number of children: Not on file   Years of education: Not on file   Highest  education level: Not on file  Occupational History   Not on file  Tobacco Use   Smoking status: Never   Smokeless tobacco: Never  Vaping Use   Vaping Use: Never used  Substance and Sexual Activity   Alcohol use: Yes    Comment: rare   Drug use: No   Sexual activity: Not on file  Other Topics Concern   Not on file  Social History Narrative   Not on file   Social Determinants of Health   Financial Resource Strain: Low Risk  (02/15/2022)   Overall Financial Resource Strain (CARDIA)    Difficulty of Paying Living Expenses: Not hard at all  Food Insecurity: No  Food Insecurity (02/15/2022)   Hunger Vital Sign    Worried About Running Out of Food in the Last Year: Never true    Ran Out of Food in the Last Year: Never true  Transportation Needs: No Transportation Needs (02/15/2022)   PRAPARE - Hydrologist (Medical): No    Lack of Transportation (Non-Medical): No  Physical Activity: Insufficiently Active (02/15/2022)   Exercise Vital Sign    Days of Exercise per Week: 3 days    Minutes of Exercise per Session: 30 min  Stress: No Stress Concern Present (02/15/2022)   [place]    Feeling of Stress : Not at all  Social Connections: Moderately Isolated (02/15/2022)   Social Connection and Isolation Panel [NHANES]    Frequency of Communication with Friends and Family: More than three times a week    Frequency of Social Gatherings with Friends and Family: More than three times a week    Attends Religious Services: Never    Marine scientist or Organizations: No    Attends Music therapist: Never    Marital Status: Married    Tobacco Counseling Counseling given: Not Answered   Clinical Intake:  Pre-visit preparation completed: No  Pain : No/denies pain     BMI - recorded: 26.52 Nutritional Status: BMI 25 -29 Overweight Nutritional Risks: None Diabetes: No  How often do you need to have someone help you when you read instructions, pamphlets, or other written materials from your doctor or pharmacy?: 1 - Never  Diabetic?  No  Interpreter Needed?: No  Information entered by :: Rolene Arbour LPN   Activities of Daily Living    02/15/2022    8:36 AM  In your present state of health, do you have any difficulty performing the following activities:  Hearing? 0  Vision? 0  Difficulty concentrating or making decisions? 0  Walking or climbing stairs? 0  Dressing or bathing? 0  Doing errands, shopping? 0  Preparing Food and  eating ? N  Using the Toilet? N  In the past six months, have you accidently leaked urine? N  Do you have problems with loss of bowel control? N  Managing your Medications? N  Managing your Finances? N  Housekeeping or managing your Housekeeping? N    Patient Care Team: Heather Post, MD as PCP - General  Indicate any recent Medical Services you may have received from other than [facility] providers in the past year (date may be approximate).     Assessment:   This is a routine wellness examination for [place].  Hearing/Vision screen Hearing Screening - Comments:: Denies hearing difficulties   Vision Screening - Comments:: Wears rx glasses - up to date with routine eye exams with  Dr Kenton Kingfisher  Dietary issues and exercise activities discussed: Current Exercise Habits: Home exercise routine, Type of exercise: walking, Time (Minutes): 30, Frequency (Times/Week): 3, Weekly Exercise (Minutes/Week): 90, Intensity: Moderate, Exercise limited by: None identified   Goals Addressed               This Visit's Progress     Patient Stated (pt-stated)        I'm trying to lose a little weight.       Depression Screen    02/15/2022    8:35 AM 02/02/2020    7:58 AM 01/30/2019    7:29 AM 01/17/2018    9:35 AM 01/14/2017   10:07 AM  PHQ 2/9 Scores  PHQ - 2 Score 0 0 0 0 0  PHQ- 9 Score   0      Fall Risk    02/15/2022    8:36 AM 02/13/2021    8:30 AM 02/02/2020    7:58 AM 01/30/2019    7:30 AM 01/17/2018    9:35 AM  Fall Risk   Falls in the past year? 0 0 0 0 No  Number falls in past yr: 0  0    Injury with Fall? 0  0    Risk for fall due to : No Fall Risks      Follow up Falls prevention discussed        FALL RISK PREVENTION PERTAINING TO THE HOME:  Any stairs in or around the home? Yes  If so, are there any without handrails? No  Home free of loose throw rugs in walkways, pet beds, electrical cords, etc? Yes  Adequate lighting in your home to reduce risk of falls? Yes    ASSISTIVE DEVICES UTILIZED TO PREVENT FALLS:  Life alert? No  Use of a cane, walker or w/c? No  Grab bars in the bathroom? No  Shower chair or bench in shower? No  Elevated toilet seat or a handicapped toilet? No   TIMED UP AND GO:  Was the test performed? No . Audio Visit   Cognitive Function:        02/15/2022    8:38 AM  6CIT Screen  What Year? 0 points  What month? 0 points  What time? 0 points  Count back from 20 0 points  Months in reverse 0 points  Repeat phrase 0 points  Total Score 0 points    Immunizations Immunization History  Administered Date(s) Administered   Influenza Split 03/11/2012, 03/23/2013   Influenza, High Dose Seasonal PF 03/21/2016, 03/02/2019   Influenza,inj,Quad PF,6+ Mos 04/02/2014, 03/09/2015, 03/22/2017, 02/24/2018   Influenza-Unspecified 03/02/2019   PFIZER(Purple Top)SARS-COV-2 Vaccination 07/28/2019, 08/18/2019   Pneumococcal Conjugate-13 01/17/2018   Pneumococcal Polysaccharide-23 01/30/2019   Td 06/05/1999   Tdap 10/02/2010   Zoster Recombinat (Shingrix) 04/18/2017, 06/20/2017   Zoster, Live 10/09/2012    TDAP status: Due, Education has been provided regarding the importance of this vaccine. Advised may receive this vaccine at local pharmacy or Health Dept. Aware to provide a copy of the vaccination record if obtained from local pharmacy or Health Dept. Verbalized acceptance and understanding.  Flu Vaccine status: Up to date  Pneumococcal vaccine status: Up to date  Covid-19 vaccine status: Completed vaccines  Qualifies for Shingles Vaccine? Yes   Zostavax completed Yes   Shingrix Completed?: Yes  Screening Tests Health Maintenance  Topic Date Due   COVID-19 Vaccine (3 - Pfizer series) 03/03/2022 (Originally 10/13/2019)   INFLUENZA VACCINE  09/02/2022 (Originally 01/02/2022)  TETANUS/TDAP  02/16/2023 (Originally 10/01/2020)   MAMMOGRAM  04/08/2022   COLONOSCOPY (Pts 45-27yr Insurance coverage will need to be  confirmed)  06/12/2027   Pneumonia Vaccine 71 Years old  Completed   DEXA SCAN  Completed   Hepatitis C Screening  Completed   Zoster Vaccines- Shingrix  Completed   HPV VACCINES  Aged Out    Health Maintenance  There are no preventive care reminders to display for this patient.   Colorectal cancer screening: Type of screening: Colonoscopy. Completed 06/11/17. Repeat every 10 years  Mammogram status: Completed 04/08/20. Repeat every year 2 Yrs  Bone Density status: Completed 06/17/18. Results reflect: Bone density results: OSTEOPOROSIS. Repeat every   years.  Lung Cancer Screening: (Low Dose CT Chest recommended if Age 79-80years, 30 pack-year currently smoking OR have quit w/in 15years.) does not qualify.     Additional Screening:  Hepatitis C Screening: does qualify; Completed 01/14/17  Vision Screening: Recommended annual ophthalmology exams for early detection of glaucoma and other disorders of the eye. Is the patient up to date with their annual eye exam?  Yes  Who is the provider or what is the name of the office in which the patient attends annual eye exams? Dr HKenton KingfisherIf pt is not established with a provider, would they like to be referred to a provider to establish care? No .   Dental Screening: Recommended annual dental exams for proper oral hygiene  Community Resource Referral / Chronic Care Management:  CRR required this visit?  No   CCM required this visit?  No      Plan:     I have personally reviewed and noted the following in the patient's chart:   Medical and social history Use of alcohol, tobacco or illicit drugs  Current medications and supplements including opioid prescriptions. Patient is not currently taking opioid prescriptions. Functional ability and status Nutritional status Physical activity Advanced directives List of other physicians Hospitalizations, surgeries, and ER visits in previous 12 months Vitals Screenings to include cognitive,  depression, and falls Referrals and appointments  In addition, I have reviewed and discussed with patient certain preventive protocols, quality metrics, and best practice recommendations. A written personalized care plan for preventive services as well as general preventive health recommendations were provided to patient.     BCriselda Peaches LPN   [id number]  Nurse Notes: None

## 2022-02-23 ENCOUNTER — Ambulatory Visit (INDEPENDENT_AMBULATORY_CARE_PROVIDER_SITE_OTHER): Payer: Medicare Other | Admitting: Family Medicine

## 2022-02-23 ENCOUNTER — Encounter: Payer: Self-pay | Admitting: Family Medicine

## 2022-02-23 VITALS — BP 120/64 | HR 61 | Temp 97.6°F | Ht 61.42 in | Wt 146.4 lb

## 2022-02-23 DIAGNOSIS — Z23 Encounter for immunization: Secondary | ICD-10-CM | POA: Diagnosis not present

## 2022-02-23 DIAGNOSIS — K219 Gastro-esophageal reflux disease without esophagitis: Secondary | ICD-10-CM

## 2022-02-23 DIAGNOSIS — I10 Essential (primary) hypertension: Secondary | ICD-10-CM

## 2022-02-23 DIAGNOSIS — E785 Hyperlipidemia, unspecified: Secondary | ICD-10-CM

## 2022-02-23 LAB — LIPID PANEL
Cholesterol: 193 mg/dL (ref 0–200)
HDL: 58 mg/dL (ref >39.00)
LDL Cholesterol: 112 mg/dL — ABNORMAL HIGH (ref 0–99)
NonHDL: 134.7
Total CHOL/HDL Ratio: 3
Triglycerides: 113 mg/dL (ref 0.0–149.0)
VLDL: 22.6 mg/dL (ref 0.0–40.0)

## 2022-02-23 LAB — HEPATIC FUNCTION PANEL
ALT: 21 U/L (ref 0–35)
AST: 19 U/L (ref 0–37)
Albumin: 4.5 g/dL (ref 3.5–5.2)
Alkaline Phosphatase: 64 U/L (ref 39–117)
Bilirubin, Direct: 0.2 mg/dL (ref 0.0–0.3)
Total Bilirubin: 1.1 mg/dL (ref 0.2–1.2)
Total Protein: 7.5 g/dL (ref 6.0–8.3)

## 2022-02-23 LAB — BASIC METABOLIC PANEL
BUN: 16 mg/dL (ref 6–23)
CO2: 28 mEq/L (ref 19–32)
Calcium: 9.8 mg/dL (ref 8.4–10.5)
Chloride: 104 mEq/L (ref 96–112)
Creatinine, Ser: 1.02 mg/dL (ref 0.40–1.20)
GFR: 56.1 mL/min — ABNORMAL LOW (ref >60.00)
Glucose, Bld: 93 mg/dL (ref 70–99)
Potassium: 4.6 mEq/L (ref 3.5–5.1)
Sodium: 141 mEq/L (ref 135–145)

## 2022-02-23 MED ORDER — LOSARTAN POTASSIUM 50 MG PO TABS: 50.0000 mg | ORAL_TABLET | Freq: Every day | ORAL | 3 refills | Status: DC

## 2022-02-23 MED ORDER — ATORVASTATIN CALCIUM 20 MG PO TABS: ORAL_TABLET | ORAL | 3 refills | Status: DC

## 2022-02-23 MED ORDER — AMLODIPINE BESYLATE 5 MG PO TABS: 5.0000 mg | ORAL_TABLET | Freq: Every day | ORAL | 3 refills | Status: DC

## 2022-02-23 NOTE — Progress Notes (Signed)
Established Patient Office Visit  Subjective   Patient ID: Heather Adams, female    DOB: 1950-09-16  Age: 71 y.o. MRN: [id number]  Chief Complaint  Patient presents with   Annual Exam    HPI   For medical follow-up.  She was initially scheduled for "annual exam ".  However, she had her Medicare wellness visit last week.  She does have hypertension treated with losartan 50 mg daily and amlodipine 5 mg daily.  Blood pressures been well controlled.  She not been exercising quite as much recently but has lost some weight due to her efforts.  She takes atorvastatin 20 mg daily for hyperlipidemia.  No side effects.  No history of CAD.  Denies any recent chest pains or dizziness.  She has history of GERD and this has been controlled with over-the-counter Pepcid as needed.  She and her husband are in the process of building a house and hope to be and in the next month or so.  Patient does need her flu vaccine.  She is considering RSV but undecided.  She has not yet decided regarding COVID booster.  Past Medical History:  Diagnosis Date   Fainting spell    with blood draws    Hyperlipidemia    Osteoarthritis of hip    right   Past Surgical History:  Procedure Laterality Date   COLONOSCOPY  01-17-12   per Dr. Darlen Round in [place], hemorrhoids, no polyps, repeat in 5 yrs    endometrosis  1986   Hemmorriod sugery  04-04-2015   hisogram  [place]  [id number]    reports that she has never smoked. She has never used smokeless tobacco. She reports current alcohol use. She reports that she does not use drugs. family history includes Hearing loss in her paternal aunt, paternal grandfather, and paternal uncle; Heart disease (age of onset: 37) in her paternal grandfather; Heart disease (age of onset: 46) in her mother; Hyperlipidemia in her mother; Hypertension in her mother; Mental retardation in her sister. Allergies  Allergen Reactions    Thimerosal (Thiomersal)     Review of Systems  Constitutional:  Negative for malaise/fatigue.  Eyes:  Negative for blurred vision.  Respiratory:  Negative for shortness of breath.   Cardiovascular:  Negative for chest pain.  Neurological:  Negative for dizziness, weakness and headaches.      Objective:     BP 120/64 (BP Location: Left Arm, Patient Position: Sitting, Cuff Size: Normal)   Pulse 61   Temp 97.6 F (36.4 C) (Oral)   Ht 5' 1.42" (1.56 m)   Wt 146 lb 6.4 oz (66.4 kg)   SpO2 99%   BMI 27.29 kg/m    Physical Exam Vitals reviewed.  Constitutional:      Appearance: She is well-developed.  Eyes:     Pupils: Pupils are equal, round, and reactive to light.  Neck:     Thyroid: No thyromegaly.     Vascular: No JVD.  Cardiovascular:     Rate and Rhythm: Normal rate and regular rhythm.     Heart sounds:     No gallop.  Pulmonary:     Effort: Pulmonary effort is normal. No respiratory distress.     Breath sounds: Normal breath sounds. No wheezing or rales.  Musculoskeletal:     Cervical back: Neck supple.     Right lower leg: No edema.     Left lower leg: No edema.  Neurological:     Mental Status: She is alert.      No results found for any visits on 02/23/22.    The 10-year ASCVD risk score (Arnett DK, et al., 2019) is: 10%    Assessment & Plan:   #1 hypertension well-controlled.  Continue losartan 50 mg daily and amlodipine 5 mg daily.  Refilled both medications for 1 year.  Check basic metabolic panel.  #2 hyperlipidemia treated with atorvastatin 20 mg daily.  Tolerating well.  Check lipid and hepatic panel.  Refill atorvastatin for 1 year.  #3 GERD stable on over-the-counter Pepcid  Flu vaccine given   No follow-ups on file.    Carolann Littler, MD

## 2022-02-23 NOTE — Addendum Note (Signed)
Addended by: Nilda Riggs on: 02/23/2022 08:57 AM   Modules accepted: Orders

## 2022-04-20 DIAGNOSIS — Z1231 Encounter for screening mammogram for malignant neoplasm of breast: Secondary | ICD-10-CM | POA: Diagnosis not present

## 2022-07-05 DIAGNOSIS — Z86010 Personal history of colonic polyps: Secondary | ICD-10-CM | POA: Diagnosis not present

## 2022-07-05 DIAGNOSIS — Z09 Encounter for follow-up examination after completed treatment for conditions other than malignant neoplasm: Secondary | ICD-10-CM | POA: Diagnosis not present

## 2022-07-05 DIAGNOSIS — K635 Polyp of colon: Secondary | ICD-10-CM | POA: Diagnosis not present

## 2022-07-05 DIAGNOSIS — K648 Other hemorrhoids: Secondary | ICD-10-CM | POA: Diagnosis not present

## 2022-07-05 DIAGNOSIS — D122 Benign neoplasm of ascending colon: Secondary | ICD-10-CM | POA: Diagnosis not present

## 2022-07-18 DIAGNOSIS — C44622 Squamous cell carcinoma of skin of right upper limb, including shoulder: Secondary | ICD-10-CM | POA: Diagnosis not present

## 2022-07-18 DIAGNOSIS — L820 Inflamed seborrheic keratosis: Secondary | ICD-10-CM | POA: Diagnosis not present

## 2022-07-18 DIAGNOSIS — D235 Other benign neoplasm of skin of trunk: Secondary | ICD-10-CM | POA: Diagnosis not present

## 2022-07-18 DIAGNOSIS — L821 Other seborrheic keratosis: Secondary | ICD-10-CM | POA: Diagnosis not present

## 2022-07-18 DIAGNOSIS — L570 Actinic keratosis: Secondary | ICD-10-CM | POA: Diagnosis not present

## 2022-09-24 ENCOUNTER — Ambulatory Visit (INDEPENDENT_AMBULATORY_CARE_PROVIDER_SITE_OTHER): Payer: Medicare Other | Admitting: Family Medicine

## 2022-09-24 ENCOUNTER — Encounter: Payer: Self-pay | Admitting: Family Medicine

## 2022-09-24 VITALS — BP 118/70 | HR 71 | Temp 98.2°F | Wt 148.0 lb

## 2022-09-24 DIAGNOSIS — M26652 Arthropathy of left temporomandibular joint: Secondary | ICD-10-CM

## 2022-09-24 NOTE — Progress Notes (Signed)
   Subjective:    Patient ID: Heather Adams, female    DOB: 02/24/1951, 72 y.o.   MRN: [id number]  HPI Here for 4 months of intermittent pain in the left jaw, and sometimes this radiates to the left ear. No hx of trauma. She often feels a grinding sensation when chewing. Chewing steak causes the pain, and opening her mouth wide causes pain.    Review of Systems  Constitutional: Negative.   HENT:  Positive for ear pain. Negative for congestion, sore throat and trouble swallowing.   Eyes: Negative.   Respiratory: Negative.         Objective:   Physical Exam Constitutional:      Appearance: Normal appearance. She is not ill-appearing.  HENT:     Right Ear: Tympanic membrane, ear canal and external ear normal.     Left Ear: Tympanic membrane, ear canal and external ear normal.     Nose: Nose normal.     Mouth/Throat:     Pharynx: Oropharynx is clear.     Comments: The left TMJ is tender and has a lot of crepitus  Eyes:     Conjunctiva/sclera: Conjunctivae normal.  Cardiovascular:     Rate and Rhythm: Normal rate and regular rhythm.     Pulses: Normal pulses.     Heart sounds: Normal heart sounds.  Pulmonary:     Effort: Pulmonary effort is normal.     Breath sounds: Normal breath sounds.  Lymphadenopathy:     Cervical: No cervical adenopathy.  Neurological:     Mental Status: She is alert.           Assessment & Plan:  TMJ pain. She can use Ibuprofen as needed. She will avoid opening her mouth wide and using a lot of force when chewing. She will see her dentist in a few weeks, and I advised her to speak with them about this.  Gershon Crane, MD

## 2023-01-17 ENCOUNTER — Encounter (INDEPENDENT_AMBULATORY_CARE_PROVIDER_SITE_OTHER): Payer: Self-pay

## 2023-01-23 DIAGNOSIS — L821 Other seborrheic keratosis: Secondary | ICD-10-CM | POA: Diagnosis not present

## 2023-01-23 DIAGNOSIS — Z85828 Personal history of other malignant neoplasm of skin: Secondary | ICD-10-CM | POA: Diagnosis not present

## 2023-01-23 DIAGNOSIS — Z129 Encounter for screening for malignant neoplasm, site unspecified: Secondary | ICD-10-CM | POA: Diagnosis not present

## 2023-01-23 DIAGNOSIS — L820 Inflamed seborrheic keratosis: Secondary | ICD-10-CM | POA: Diagnosis not present

## 2023-02-15 ENCOUNTER — Other Ambulatory Visit: Payer: Self-pay | Admitting: Family Medicine

## 2023-02-15 DIAGNOSIS — E785 Hyperlipidemia, unspecified: Secondary | ICD-10-CM

## 2023-02-17 ENCOUNTER — Other Ambulatory Visit: Payer: Self-pay | Admitting: Family Medicine

## 2023-02-17 DIAGNOSIS — E785 Hyperlipidemia, unspecified: Secondary | ICD-10-CM

## 2023-02-20 ENCOUNTER — Ambulatory Visit (INDEPENDENT_AMBULATORY_CARE_PROVIDER_SITE_OTHER): Payer: Medicare Other

## 2023-02-20 VITALS — Ht 61.5 in | Wt 148.0 lb

## 2023-02-20 DIAGNOSIS — Z0000 Encounter for general adult medical examination without abnormal findings: Secondary | ICD-10-CM | POA: Diagnosis not present

## 2023-02-20 NOTE — Patient Instructions (Addendum)
Heather Adams , Thank you for taking time to come for your Medicare Wellness Visit. I appreciate your ongoing commitment to your health goals. Please review the following plan we discussed and let me know if I can assist you in the future.   Referrals/Orders/Follow-Ups/Clinician Recommendations:   This is a list of the screening recommended for you and due dates:  Health Maintenance  Topic Date Due   DTaP/Tdap/Td vaccine (3 - Td or Tdap) 10/01/2020   Mammogram  04/08/2022   Flu Shot  01/03/2023   COVID-19 Vaccine (3 - 2023-24 season) 02/03/2023   Medicare Annual Wellness Visit  02/20/2024   Colon Cancer Screening  07/05/2032   Pneumonia Vaccine  Completed   DEXA scan (bone density measurement)  Completed   Hepatitis C Screening  Completed   Zoster (Shingles) Vaccine  Completed   HPV Vaccine  Aged Out    Advanced directives: (Copy Requested) Please bring a copy of your health care power of attorney and living will to the office to be added to your chart at your convenience.  Next Medicare Annual Wellness Visit scheduled for next year: Yes

## 2023-02-20 NOTE — Progress Notes (Signed)
Subjective:   Heather Adams is a 72 y.o. female who presents for Medicare Annual (Subsequent) preventive examination.  Visit Complete: Virtual  I connected with  Fransico Meadow on 02/20/23 by a audio enabled telemedicine application and verified that I am speaking with the correct person using two identifiers.  Patient Location: Home  Provider Location: Home Office  I discussed the limitations of evaluation and management by telemedicine. The patient expressed understanding and agreed to proceed.  Vital Signs: Unable to obtain new vitals due to this being a telehealth visit.   Cardiac Risk Factors include: advanced age (>39men, >10 women);dyslipidemia;hypertension     Objective:    Today's Vitals   02/20/23 1349  Weight: 148 lb (67.1 kg)  Height: 5' 1.5" (1.562 m)   Body mass index is 27.51 kg/m.     02/20/2023    1:55 PM 02/15/2022    8:37 AM  Advanced Directives  Does Patient Have a Medical Advance Directive? Yes Yes  Type of Estate agent of [place];Living will Healthcare Power of [place];Living will  Copy of Healthcare Power of Attorney in Chart? No - copy requested No - copy requested    Current Medications (verified) Outpatient Encounter Medications as of 02/20/2023  Medication Sig   amLODipine (NORVASC) 5 MG tablet Take 1 tablet (5 mg total) by mouth daily.   atorvastatin (LIPITOR) 20 MG tablet TAKE 1 TABLET BY MOUTH DAILY   Calcium Carbonate-Vit D-Min (CALTRATE 600+D PLUS) 600-400 MG-UNIT per tablet Take 1 tablet by mouth daily.   Cetirizine HCl (ZYRTEC ALLERGY) 10 MG CAPS    famotidine (PEPCID) 20 MG tablet Pepcid   fluticasone (FLONASE) 50 MCG/ACT nasal spray Place 1 spray into both nostrils as needed for allergies or rhinitis.   Glucosamine-Chondroit-Vit C-Mn (GLUCOSAMINE 1500 COMPLEX PO) Glucosamine   ibuprofen (ADVIL) 800 MG tablet ibuprofen 800 mg tablet   losartan (COZAAR) 50 MG tablet TAKE 1 TABLET BY MOUTH DAILY   No  facility-administered encounter medications on file as of 02/20/2023.    Allergies (verified) Thimerosal (thiomersal)   History: Past Medical History:  Diagnosis Date   Fainting spell    with blood draws    Hyperlipidemia    Osteoarthritis of hip    right   Past Surgical History:  Procedure Laterality Date   COLONOSCOPY  01-17-12   per Dr. Deniece Portela in [place], hemorrhoids, no polyps, repeat in 5 yrs    endometrosis  1986   Hemmorriod sugery  04-04-2015   hisogram  1986   TONSILLECTOMY  1977   TOTAL HIP ARTHROPLASTY  2010,2011   Family History  Problem Relation Age of Onset   Hypertension Mother    Heart disease Mother 64       CHF   Hyperlipidemia Mother    Mental retardation Sister    Hearing loss Paternal Aunt    Hearing loss Paternal Uncle    Heart disease Paternal Grandfather 25   Hearing loss Paternal Grandfather    Social History   Socioeconomic History   Marital status: Married    Spouse name: Not on file   Number of children: Not on file   Years of education: Not on file   Highest education level: Not on file  Occupational History   Not on file  Tobacco Use   Smoking status: Never   Smokeless tobacco: Never  Vaping Use   Vaping status: Never Used  Substance and Sexual Activity   Alcohol use: Yes    Comment: rare  Drug use: No   Sexual activity: Not on file  Other Topics Concern   Not on file  Social History Narrative   Not on file   Social Determinants of Health   Financial Resource Strain: Low Risk  (02/20/2023)   Overall Financial Resource Strain (CARDIA)    Difficulty of Paying Living Expenses: Not hard at all  Food Insecurity: No Food Insecurity (02/20/2023)   Hunger Vital Sign    Worried About Running Out of Food in the Last Year: Never true    Ran Out of Food in the Last Year: Never true  Transportation Needs: No Transportation Needs (02/20/2023)   PRAPARE - Administrator, Civil Service (Medical): No    Lack of  Transportation (Non-Medical): No  Physical Activity: Sufficiently Active (02/20/2023)   Exercise Vital Sign    Days of Exercise per Week: 5 days    Minutes of Exercise per Session: 40 min  Stress: No Stress Concern Present (02/20/2023)   [employer] of Occupational Health - Occupational Stress Questionnaire    Feeling of Stress : Not at all  Social Connections: Moderately Isolated (02/20/2023)   Social Connection and Isolation Panel [NHANES]    Frequency of Communication with Friends and Family: More than three times a week    Frequency of Social Gatherings with Friends and Family: More than three times a week    Attends Religious Services: Never    Database administrator or Organizations: No    Attends Engineer, structural: Never    Marital Status: Married    Tobacco Counseling Counseling given: Not Answered   Clinical Intake:  Pre-visit preparation completed: Yes  Pain : No/denies pain     BMI - recorded: 27.51 Nutritional Status: BMI 25 -29 Overweight Nutritional Risks: None Diabetes: No  How often do you need to have someone help you when you read instructions, pamphlets, or other written materials from your doctor or pharmacy?: 1 - Never  Interpreter Needed?: No  Information entered by :: Theresa Mulligan LPN   Activities of Daily Living    02/20/2023    1:54 PM  In your present state of health, do you have any difficulty performing the following activities:  Hearing? 0  Vision? 0  Difficulty concentrating or making decisions? 0  Walking or climbing stairs? 0  Dressing or bathing? 0  Doing errands, shopping? 0  Preparing Food and eating ? N  Using the Toilet? N  In the past six months, have you accidently leaked urine? N  Do you have problems with loss of bowel control? N  Managing your Medications? N  Managing your Finances? N  Housekeeping or managing your Housekeeping? N    Patient Care Team: Kristian Covey, MD as PCP -  General  Indicate any recent Medical Services you may have received from other than [facility] providers in the past year (date may be approximate).     Assessment:   This is a routine wellness examination for [place].  Hearing/Vision screen Hearing Screening - Comments:: Denies hearing difficulties   Vision Screening - Comments:: Wears rx glasses - up to date with routine eye exams with  [facility]   Goals Addressed               This Visit's Progress     Lose weight (pt-stated)         Depression Screen    02/20/2023    1:53 PM 02/15/2022    8:35  AM 02/02/2020    7:58 AM 01/30/2019    7:29 AM 01/17/2018    9:35 AM 01/14/2017   10:07 AM  PHQ 2/9 Scores  PHQ - 2 Score 0 0 0 0 0 0  PHQ- 9 Score    0      Fall Risk    02/20/2023    1:54 PM 02/15/2022    8:36 AM 02/13/2021    8:30 AM 02/02/2020    7:58 AM 01/30/2019    7:30 AM  Fall Risk   Falls in the past year? 1 0 0 0 0  Number falls in past yr: 0 0  0   Injury with Fall? 0 0  0   Risk for fall due to : No Fall Risks No Fall Risks     Follow up Falls prevention discussed Falls prevention discussed       MEDICARE RISK AT HOME: Medicare Risk at Home Any stairs in or around the home?: Yes If so, are there any without handrails?: No Home free of loose throw rugs in walkways, pet beds, electrical cords, etc?: Yes Adequate lighting in your home to reduce risk of falls?: Yes Life alert?: No Use of a cane, walker or w/c?: No Grab bars in the bathroom?: No Shower chair or bench in shower?: Yes Elevated toilet seat or a handicapped toilet?: No  TIMED UP AND GO:  Was the test performed?  No    Cognitive Function:        02/20/2023    1:55 PM 02/15/2022    8:38 AM  6CIT Screen  What Year? 0 points 0 points  What month? 0 points 0 points  What time? 0 points 0 points  Count back from 20 0 points 0 points  Months in reverse 0 points 0 points  Repeat phrase 0 points 0 points  Total Score 0 points 0 points     Immunizations Immunization History  Administered Date(s) Administered   Fluad Quad(Adams Dose 65+) 02/23/2022   Influenza Split 03/11/2012, 03/23/2013   Influenza, Adams Dose Seasonal PF 03/21/2016, 03/02/2019   Influenza,inj,Quad PF,6+ Mos 04/02/2014, 03/09/2015, 03/22/2017, 02/24/2018   Influenza-Unspecified 03/02/2019   PFIZER(Purple Top)SARS-COV-2 Vaccination 07/28/2019, 08/18/2019   Pneumococcal Conjugate-13 01/17/2018   Pneumococcal Polysaccharide-23 01/30/2019   Td 06/05/1999   Tdap 10/02/2010   Zoster Recombinant(Shingrix) 04/18/2017, 06/20/2017   Zoster, Live 10/09/2012    TDAP status: Due, Education has been provided regarding the importance of this vaccine. Advised may receive this vaccine at local pharmacy or Health Dept. Aware to provide a copy of the vaccination record if obtained from local pharmacy or Health Dept. Verbalized acceptance and understanding.  Flu Vaccine status: Due, Education has been provided regarding the importance of this vaccine. Advised may receive this vaccine at local pharmacy or Health Dept. Aware to provide a copy of the vaccination record if obtained from local pharmacy or Health Dept. Verbalized acceptance and understanding.  Pneumococcal vaccine status: Up to date  Covid-19 vaccine status: Declined, Education has been provided regarding the importance of this vaccine but patient still declined. Advised may receive this vaccine at local pharmacy or Health Dept.or vaccine clinic. Aware to provide a copy of the vaccination record if obtained from local pharmacy or Health Dept. Verbalized acceptance and understanding.  Qualifies for Shingles Vaccine? Yes   Zostavax completed Yes   Shingrix Completed?: Yes  Screening Tests Health Maintenance  Topic Date Due   DTaP/Tdap/Td (3 - Td or Tdap) 10/01/2020   MAMMOGRAM  04/08/2022   INFLUENZA VACCINE  01/03/2023   COVID-19 Vaccine (3 - 2023-24 season) 02/03/2023   Medicare Annual Wellness (AWV)   02/20/2024   Colonoscopy  07/05/2032   Pneumonia Vaccine 50+ Years old  Completed   DEXA SCAN  Completed   Hepatitis C Screening  Completed   Zoster Vaccines- Shingrix  Completed   HPV VACCINES  Aged Out    Health Maintenance  Health Maintenance Due  Topic Date Due   DTaP/Tdap/Td (3 - Td or Tdap) 10/01/2020   MAMMOGRAM  04/08/2022   INFLUENZA VACCINE  01/03/2023   COVID-19 Vaccine (3 - 2023-24 season) 02/03/2023    Colorectal cancer screening: Type of screening: Colonoscopy. Completed 07/05/22. Repeat every 10 years  Mammogram status: Ordered Deferred. Pt provided with contact info and advised to call to schedule appt.   Bone Density status: Completed 06/17/18. Results reflect: Bone density results: OSTEOPOROSIS. Repeat every   years.    Additional Screening:  Hepatitis C Screening: does qualify; Completed 01/14/17  Vision Screening: Recommended annual ophthalmology exams for early detection of glaucoma and other disorders of the eye. Is the patient up to date with their annual eye exam?  Yes  Who is the provider or what is the name of the office in which the patient attends annual eye exams? [facility] If pt is not established with a provider, would they like to be referred to a provider to establish care? No .   Dental Screening: Recommended annual dental exams for proper oral hygiene    Community Resource Referral / Chronic Care Management:  CRR required this visit?  No   CCM required this visit?  No     Plan:     I have personally reviewed and noted the following in the patient's chart:   Medical and social history Use of alcohol, tobacco or illicit drugs  Current medications and supplements including opioid prescriptions. Patient is not currently taking opioid prescriptions. Functional ability and status Nutritional status Physical activity Advanced directives List of other physicians Hospitalizations, surgeries, and ER visits in previous 12  months Vitals Screenings to include cognitive, depression, and falls Referrals and appointments  In addition, I have reviewed and discussed with patient certain preventive protocols, quality metrics, and best practice recommendations. A written personalized care plan for preventive services as well as general preventive health recommendations were provided to patient.     Tillie Rung, LPN   1/61/0960   After Visit Summary: (MyChart) Due to this being a telephonic visit, the after visit summary with patients personalized plan was offered to patient via MyChart   Nurse Notes: None

## 2023-02-26 ENCOUNTER — Other Ambulatory Visit: Payer: Self-pay | Admitting: Family Medicine

## 2023-03-20 ENCOUNTER — Ambulatory Visit: Payer: Medicare Other | Admitting: Family Medicine

## 2023-03-20 ENCOUNTER — Encounter: Payer: Self-pay | Admitting: Family Medicine

## 2023-03-20 VITALS — BP 130/70 | HR 65 | Temp 98.0°F | Ht 61.42 in | Wt 146.5 lb

## 2023-03-20 DIAGNOSIS — E785 Hyperlipidemia, unspecified: Secondary | ICD-10-CM | POA: Diagnosis not present

## 2023-03-20 DIAGNOSIS — I10 Essential (primary) hypertension: Secondary | ICD-10-CM | POA: Diagnosis not present

## 2023-03-20 DIAGNOSIS — Z23 Encounter for immunization: Secondary | ICD-10-CM | POA: Diagnosis not present

## 2023-03-20 DIAGNOSIS — K439 Ventral hernia without obstruction or gangrene: Secondary | ICD-10-CM | POA: Diagnosis not present

## 2023-03-20 LAB — LIPID PANEL
Cholesterol: 200 mg/dL (ref 0–200)
HDL: 54.1 mg/dL (ref >39.00)
LDL Cholesterol: 121 mg/dL — ABNORMAL HIGH (ref 0–99)
NonHDL: 145.61
Total CHOL/HDL Ratio: 4
Triglycerides: 122 mg/dL (ref 0.0–149.0)
VLDL: 24.4 mg/dL (ref 0.0–40.0)

## 2023-03-20 LAB — HEPATIC FUNCTION PANEL
ALT: 13 U/L (ref 0–35)
AST: 18 U/L (ref 0–37)
Albumin: 4.7 g/dL (ref 3.5–5.2)
Alkaline Phosphatase: 69 U/L (ref 39–117)
Bilirubin, Direct: 0.1 mg/dL (ref 0.0–0.3)
Total Bilirubin: 0.9 mg/dL (ref 0.2–1.2)
Total Protein: 7.5 g/dL (ref 6.0–8.3)

## 2023-03-20 LAB — BASIC METABOLIC PANEL
BUN: 16 mg/dL (ref 6–23)
CO2: 29 meq/L (ref 19–32)
Calcium: 10 mg/dL (ref 8.4–10.5)
Chloride: 103 meq/L (ref 96–112)
Creatinine, Ser: 0.96 mg/dL (ref 0.40–1.20)
GFR: 59.88 mL/min — ABNORMAL LOW (ref >60.00)
Glucose, Bld: 97 mg/dL (ref 70–99)
Potassium: 4.7 meq/L (ref 3.5–5.1)
Sodium: 140 meq/L (ref 135–145)

## 2023-03-20 MED ORDER — AMLODIPINE BESYLATE 5 MG PO TABS: 5.0000 mg | ORAL_TABLET | Freq: Every day | ORAL | 3 refills | Status: DC

## 2023-03-20 MED ORDER — LOSARTAN POTASSIUM 50 MG PO TABS: 50.0000 mg | ORAL_TABLET | Freq: Every day | ORAL | 3 refills | Status: DC

## 2023-03-20 MED ORDER — ATORVASTATIN CALCIUM 20 MG PO TABS
ORAL_TABLET | ORAL | 3 refills | Status: DC
Start: 2023-03-20 — End: 2024-03-25

## 2023-03-20 NOTE — Progress Notes (Signed)
Established Patient Office Visit  Subjective   Patient ID: Ellean Firman, female    DOB: 03/06/1951  Age: 72 y.o. MRN: [id number]  Chief Complaint  Patient presents with   Annual Exam    HPI   Heather Adams is seen for annual medical follow-up.  Past medical history significant for hypertension, hyperlipidemia, GERD.  Her blood pressure medications include amlodipine 5 mg daily, losartan 50 mg daily.  Takes atorvastatin 20 mg daily for hyperlipidemia.  Needs refills of medication.  Generally feels well.  Very stressful year.  Her mother-in-law passed away during the past year of chronic health complications.  She and her husband built a new house within the past year here and are currently in process of building a house at [employer].  Her husband looks to retire at the end of this year or possibly January.  [place] has ventral hernia but for the most part not painful.  Bulges with lifting and activities.  Past Medical History:  Diagnosis Date   Fainting spell    with blood draws    Hyperlipidemia    Osteoarthritis of hip    right   Past Surgical History:  Procedure Laterality Date   COLONOSCOPY  01-17-12   per Dr. Deniece Portela in [place], hemorrhoids, no polyps, repeat in 5 yrs    endometrosis  1986   Hemmorriod sugery  04-04-2015   hisogram  1986   TONSILLECTOMY  1977   TOTAL HIP ARTHROPLASTY  4098,1191    reports that she has never smoked. She has never used smokeless tobacco. She reports current alcohol use. She reports that she does not use drugs. family history includes Hearing loss in her paternal aunt, paternal grandfather, and paternal uncle; Heart disease (age of onset: 34) in her paternal grandfather; Heart disease (age of onset: 51) in her mother; Hyperlipidemia in her mother; Hypertension in her mother; Mental retardation in her sister. Allergies  Allergen Reactions   Thimerosal (Thiomersal)     Review of Systems  Constitutional:  Negative for malaise/fatigue.   Eyes:  Negative for blurred vision.  Respiratory:  Negative for shortness of breath.   Cardiovascular:  Negative for chest pain.  Gastrointestinal:  Negative for abdominal pain, nausea and vomiting.  Neurological:  Negative for dizziness, weakness and headaches.      Objective:     BP 130/70 (BP Location: Left Arm, Patient Position: Sitting, Cuff Size: Normal)   Pulse 65   Temp 98 F (36.7 C) (Oral)   Ht 5' 1.42" (1.56 m)   Wt 146 lb 8 oz (66.5 kg)   SpO2 99%   BMI 27.31 kg/m  BP Readings from Last 3 Encounters:  03/20/23 130/70  09/24/22 118/70  02/23/22 120/64   Wt Readings from Last 3 Encounters:  03/20/23 146 lb 8 oz (66.5 kg)  02/20/23 148 lb (67.1 kg)  09/24/22 148 lb (67.1 kg)      Physical Exam Vitals reviewed.  Constitutional:      Appearance: She is well-developed.  Eyes:     Pupils: Pupils are equal, round, and reactive to light.  Neck:     Thyroid: No thyromegaly.     Vascular: No JVD.  Cardiovascular:     Rate and Rhythm: Normal rate and regular rhythm.     Heart sounds:     No gallop.  Pulmonary:     Effort: Pulmonary effort is normal. No respiratory distress.     Breath sounds: Normal breath sounds. No wheezing or rales.  Abdominal:     Comments: Normal bowel sounds.  She has ventral hernia which is soft and nontender  Musculoskeletal:     Cervical back: Neck supple.  Neurological:     Mental Status: She is alert.      No results found for any visits on 03/20/23.    The 10-year ASCVD risk score (Arnett DK, et al., 2019) is: 12.7%    Assessment & Plan:   #1 hypertension stable.  Continue losartan and amlodipine with refills given for 1 year.  Check basic metabolic panel.  Continue low-sodium diet.  #2 hyperlipidemia treated with atorvastatin 20 mg daily.  Recheck lipid and hepatic panel.  Refill atorvastatin for 1 year  #3 ventral hernia.  Mostly asymptomatic.  Reassurance given.  Follow-up for any increased pain or other  concerns   No follow-ups on file.    Evelena Peat, MD

## 2023-05-06 DIAGNOSIS — Z1231 Encounter for screening mammogram for malignant neoplasm of breast: Secondary | ICD-10-CM | POA: Diagnosis not present

## 2023-05-14 DIAGNOSIS — Z01419 Encounter for gynecological examination (general) (routine) without abnormal findings: Secondary | ICD-10-CM | POA: Diagnosis not present

## 2023-05-14 DIAGNOSIS — Z1331 Encounter for screening for depression: Secondary | ICD-10-CM | POA: Diagnosis not present

## 2023-05-15 DIAGNOSIS — Z13820 Encounter for screening for osteoporosis: Secondary | ICD-10-CM | POA: Diagnosis not present

## 2023-05-15 DIAGNOSIS — M8580 Other specified disorders of bone density and structure, unspecified site: Secondary | ICD-10-CM | POA: Diagnosis not present

## 2023-05-15 DIAGNOSIS — M85832 Other specified disorders of bone density and structure, left forearm: Secondary | ICD-10-CM | POA: Diagnosis not present

## 2023-05-20 DIAGNOSIS — L820 Inflamed seborrheic keratosis: Secondary | ICD-10-CM | POA: Diagnosis not present

## 2023-05-20 DIAGNOSIS — D485 Neoplasm of uncertain behavior of skin: Secondary | ICD-10-CM | POA: Diagnosis not present

## 2023-08-12 DIAGNOSIS — D235 Other benign neoplasm of skin of trunk: Secondary | ICD-10-CM | POA: Diagnosis not present

## 2023-08-12 DIAGNOSIS — L570 Actinic keratosis: Secondary | ICD-10-CM | POA: Diagnosis not present

## 2023-08-12 DIAGNOSIS — Z85828 Personal history of other malignant neoplasm of skin: Secondary | ICD-10-CM | POA: Diagnosis not present

## 2023-08-12 DIAGNOSIS — L820 Inflamed seborrheic keratosis: Secondary | ICD-10-CM | POA: Diagnosis not present

## 2023-08-12 DIAGNOSIS — Z129 Encounter for screening for malignant neoplasm, site unspecified: Secondary | ICD-10-CM | POA: Diagnosis not present

## 2023-08-12 DIAGNOSIS — L218 Other seborrheic dermatitis: Secondary | ICD-10-CM | POA: Diagnosis not present

## 2023-12-02 ENCOUNTER — Ambulatory Visit: Payer: Self-pay

## 2023-12-02 NOTE — Telephone Encounter (Signed)
 FYI Only or Action Required?: FYI only for provider.  Patient was last seen in primary care on 03/20/2023 by Micheal Wolm ORN, MD. Called Nurse Triage reporting No chief complaint on file.. Symptoms began several days ago. Interventions attempted: OTC medications: Tylenol and Rest, hydration, or home remedies. Symptoms are: gradually worsening.  Triage Disposition: See Physician Within 24 Hours  Patient/caregiver understands and will follow disposition?: Yes   Copied from CRM [id number]. Topic: Clinical - Red Word Triage >> Dec 02, 2023  9:22 AM Heather Adams wrote: Red Word that prompted transfer to Nurse Triage: Patient states has had a productive cough and fever of 101 for the last week. Reason for Disposition  [place]  Answer Assessment - Initial Assessment Questions 1. ONSET: When did the cough begin?      Several Days ago  2. SEVERITY: How bad is the cough today?      Intermittent  3. SPUTUM: Describe the color of your sputum (none, dry cough; clear, white, yellow, green)     Yellow  4. HEMOPTYSIS: Are you coughing up any blood? If so ask: How much? (flecks, streaks, tablespoons, etc.)     No  5. DIFFICULTY BREATHING: Are you having difficulty breathing? If Yes, ask: How bad is it? (e.g., mild, moderate, severe)    - MILD: No SOB at rest, mild SOB with walking, speaks normally in sentences, can lie down, no retractions, pulse < 100.    - MODERATE: SOB at rest, SOB with minimal exertion and prefers to sit, cannot lie down flat, speaks in phrases, mild retractions, audible wheezing, pulse 100-120.    - SEVERE: Very SOB at rest, speaks in single words, struggling to breathe, sitting hunched forward, retractions, pulse > 120      No  6. FEVER: Do you have a fever? If Yes, ask: What is your temperature, how was it measured, and when did it start?     No  7. CARDIAC HISTORY: Do you have any history of heart disease? (e.g., heart attack, congestive heart failure)       Hypertension  8. LUNG HISTORY: Do you have any history of lung disease?  (e.g., pulmonary embolus, asthma, emphysema)     No  9. PE RISK FACTORS: Do you have a history of blood clots? (or: recent major surgery, recent prolonged travel, bedridden)     No  10. OTHER SYMPTOMS: Do you have any other symptoms? (e.g., runny nose, wheezing, chest pain)       Fatigue, Runny Nose  11. PREGNANCY: Is there any chance you are pregnant? When was your last menstrual period?       No and No  12. TRAVEL: Have you traveled out of the country in the last month? (e.g., travel history, exposures)       Unsure  Protocols used: Cough - Acute Productive-A-AH

## 2023-12-03 ENCOUNTER — Encounter: Payer: Self-pay | Admitting: Family Medicine

## 2023-12-03 ENCOUNTER — Ambulatory Visit (INDEPENDENT_AMBULATORY_CARE_PROVIDER_SITE_OTHER)

## 2023-12-03 ENCOUNTER — Ambulatory Visit: Admitting: Family Medicine

## 2023-12-03 VITALS — BP 124/60 | HR 109 | Temp 98.4°F | Wt 156.4 lb

## 2023-12-03 DIAGNOSIS — R509 Fever, unspecified: Secondary | ICD-10-CM | POA: Diagnosis not present

## 2023-12-03 DIAGNOSIS — R059 Cough, unspecified: Secondary | ICD-10-CM | POA: Diagnosis not present

## 2023-12-03 DIAGNOSIS — R051 Acute cough: Secondary | ICD-10-CM

## 2023-12-03 MED ORDER — DOXYCYCLINE HYCLATE 100 MG PO CAPS: 100.0000 mg | ORAL_CAPSULE | Freq: Two times a day (BID) | ORAL | 0 refills | Status: DC

## 2023-12-03 MED ORDER — PREDNISONE 20 MG PO TABS: ORAL_TABLET | ORAL | 0 refills | Status: DC

## 2023-12-03 MED ORDER — ALBUTEROL SULFATE HFA 108 (90 BASE) MCG/ACT IN AERS: 2.0000 | INHALATION_SPRAY | Freq: Four times a day (QID) | RESPIRATORY_TRACT | 0 refills | Status: DC | PRN

## 2023-12-03 NOTE — Progress Notes (Signed)
 Established Patient Office Visit  Subjective   Patient ID: Heather Adams, female    DOB: 18-Jan-1951  Age: 73 y.o. MRN: [id number]  Chief Complaint  Patient presents with   Cough    Patient complains of productive cough with yellow sputum, x1 week, Tried Mucinex and Tyenol   Fever    HPI   Heather Adams is seen with respiratory illness.  She and her husband just got back Saturday from cruise to [place] .  She states she had onset of respiratory symptoms about 10 to 11 days ago.  Her symptoms actually occurred after her husband already had similar type symptoms.  She states that last week she had temperature up to 101 for couple days.  Feels somewhat less feverish now.  Cough productive of yellow sputum.  Has taken over-the-counter Mucinex and Tylenol.  Occasional mild dyspnea.  Has had some intermittent wheezing.  No history of smoking.  No chest pain.  No calf pain or peripheral edema.  Past Medical History:  Diagnosis Date   Fainting spell    with blood draws    Hyperlipidemia    Osteoarthritis of hip    right   Past Surgical History:  Procedure Laterality Date   COLONOSCOPY  01-17-12   per Dr. Alyce Lesches in [place], hemorrhoids, no polyps, repeat in 5 yrs    endometrosis  1986   Hemmorriod sugery  04-04-2015   hisogram  1986   TONSILLECTOMY  1977   TOTAL HIP ARTHROPLASTY  7989,7988    reports that she has never smoked. She has never used smokeless tobacco. She reports current alcohol use. She reports that she does not use drugs. family history includes Hearing loss in her paternal aunt, paternal grandfather, and paternal uncle; Heart disease (age of onset: 82) in her paternal grandfather; Heart disease (age of onset: 86) in her mother; Hyperlipidemia in her mother; Hypertension in her mother; Mental retardation in her sister. Allergies  Allergen Reactions   Thimerosal (Thiomersal)     Review of Systems  Constitutional:  Positive for fever.  HENT:  Negative for sinus pain  and sore throat.   Respiratory:  Positive for cough, sputum production and wheezing. Negative for hemoptysis.   Cardiovascular:  Negative for chest pain.      Objective:     BP 124/60 (BP Location: Left Arm, Patient Position: Sitting, Cuff Size: Normal)   Pulse (!) 109   Temp 98.4 F (36.9 C) (Oral)   Wt 156 lb 6.4 oz (70.9 kg)   SpO2 93%   BMI 29.15 kg/m  BP Readings from Last 3 Encounters:  12/03/23 124/60  03/20/23 130/70  09/24/22 118/70   Wt Readings from Last 3 Encounters:  12/03/23 156 lb 6.4 oz (70.9 kg)  03/20/23 146 lb 8 oz (66.5 kg)  02/20/23 148 lb (67.1 kg)      Physical Exam Vitals reviewed.  Constitutional:      General: She is not in acute distress.    Appearance: She is not ill-appearing.  HENT:     Right Ear: Tympanic membrane normal.     Left Ear: Tympanic membrane normal.     Mouth/Throat:     Mouth: Mucous membranes are moist.     Pharynx: Oropharynx is clear.   Cardiovascular:     Rate and Rhythm: Normal rate and regular rhythm.  Pulmonary:     Comments: She has some diffuse expiratory wheezes.  Slightly diminished breath sounds right base compared to the left.  No rales.  No retractions.  O2 sat 93% room air  Musculoskeletal:     Right lower leg: No edema.     Left lower leg: No edema.   Neurological:     Mental Status: She is alert.      No results found for any visits on 12/03/23.    The 10-year ASCVD risk score (Arnett DK, et al., 2019) is: 13.2%    Assessment & Plan:   Problem List Items Addressed This Visit   None Visit Diagnoses       Acute cough    -  Primary   Relevant Orders   DG Chest 2 View     Acute upper respiratory illness with productive cough and reactive airway changes on exam.  She is in no respiratory distress.  Concerning is that she has had some persistent low-grade fever for several days.  She is nontoxic in appearance.  -Start prednisone 20 mg 2 tablets daily for 5 days - Start doxycycline  100  mg twice daily for 7 days - Albuterol MDI 2 puffs every 4-6 hours as needed for wheeze - Obtain chest x-ray to rule out infiltrate - Follow-up immediately for increased fever, shortness of breath, or other concern  No follow-ups on file.    Wolm Scarlet, MD

## 2023-12-04 ENCOUNTER — Ambulatory Visit: Payer: Self-pay | Admitting: Family Medicine

## 2024-01-28 ENCOUNTER — Ambulatory Visit: Admitting: Family Medicine

## 2024-01-28 VITALS — BP 120/60 | HR 76 | Temp 98.1°F | Wt 152.0 lb

## 2024-01-28 DIAGNOSIS — M79604 Pain in right leg: Secondary | ICD-10-CM

## 2024-01-28 NOTE — Progress Notes (Signed)
 Established Patient Office Visit  Subjective   Patient ID: Heather Adams, female    DOB: 1950-06-17  Age: 73 y.o. MRN: [id number]  Chief Complaint  Patient presents with   Leg Pain    HPI   Heather Adams is seen for right lateral leg pain for about 1-1/2 to 2 weeks.  Denies any injury.  Heather Adams describes this as an achy type quality.  At first Heather Adams thought there may have been some sort of insect bite but Heather Adams is not seeing any rash.  No ecchymosis.  No calf pain.  No pain with plantarflexion or dorsiflexion.  Still walking up to 2 miles per day but notices increased achiness and soreness at night after prolonged walking.  Seems to have less pain early in the day.  Past Medical History:  Diagnosis Date   Fainting spell    with blood draws    Hyperlipidemia    Osteoarthritis of hip    right   Past Surgical History:  Procedure Laterality Date   COLONOSCOPY  01-17-12   per Dr. Alyce Lesches in [place], hemorrhoids, no polyps, repeat in 5 yrs    endometrosis  1986   Hemmorriod sugery  04-04-2015   hisogram  1986   TONSILLECTOMY  1977   TOTAL HIP ARTHROPLASTY  7989,7988    reports that Heather Adams has never smoked. Heather Adams has never used smokeless tobacco. Heather Adams reports current alcohol use. Heather Adams reports that Heather Adams does not use drugs. family history includes Hearing loss in her paternal aunt, paternal grandfather, and paternal uncle; Heart disease (age of onset: 79) in her paternal grandfather; Heart disease (age of onset: 65) in her mother; Hyperlipidemia in her mother; Hypertension in her mother; Mental retardation in her sister. Allergies  Allergen Reactions   Thimerosal (Thiomersal)     Review of Systems  Constitutional:  Negative for chills and fever.  Respiratory:  Negative for shortness of breath.   Cardiovascular:  Negative for chest pain.  Musculoskeletal:  Negative for back pain.  Neurological:  Negative for tingling and focal weakness.      Objective:     BP 120/60   Pulse 76   Temp  98.1 F (36.7 C) (Oral)   Wt 152 lb (68.9 kg)   SpO2 97%   BMI 28.33 kg/m  BP Readings from Last 3 Encounters:  01/28/24 120/60  12/03/23 124/60  03/20/23 130/70   Wt Readings from Last 3 Encounters:  01/28/24 152 lb (68.9 kg)  12/03/23 156 lb 6.4 oz (70.9 kg)  03/20/23 146 lb 8 oz (66.5 kg)      Physical Exam Vitals reviewed.  Constitutional:      General: Heather Adams is not in acute distress. Cardiovascular:     Rate and Rhythm: Normal rate and regular rhythm.  Musculoskeletal:     Right lower leg: No edema.     Left lower leg: No edema.     Comments: Right leg reveals no edema.  No erythema or ecchymosis.  No warmth.  No localized reproducible tenderness.  No bony tenderness involving the tibia or fibula.  No pain with dorsiflexion or plantarflexion.  No calf tenderness.  Skin:    Findings: No rash.  Neurological:     Mental Status: Heather Adams is alert.     Comments: Full strength lower extremity.  2+ reflexes ankle and knee bilaterally      No results found for any visits on 01/28/24.    The 10-year ASCVD risk score (Arnett DK, et al., 2019) is:  12.4%    Assessment & Plan:   Right lateral leg pain.  Suspect muscular origin.  Clinically, no evidence to suggest DVT.  No claudication symptoms.  No reported injury.  -Recommend stretching with suggested exercises given - Consider icing 15 to 20 minutes especially after walking - Recommend trial of over-the-counter Voltaren gel to use 3-4 times daily as needed - Be in touch in 2 to 3 weeks if not improving  Wolm Scarlet, MD

## 2024-01-28 NOTE — Patient Instructions (Signed)
 Suspect muscular origin of pain.   Recommend:  - stretches, as discussed -icing 15-20 minutes, especially after walking/exercise -consider over the counter Voltaren gel to use 3-4 times daily.  - let me know if no better in 2-3 weeks.

## 2024-02-06 ENCOUNTER — Encounter: Payer: Self-pay | Admitting: Family Medicine

## 2024-02-06 DIAGNOSIS — M79604 Pain in right leg: Secondary | ICD-10-CM

## 2024-02-07 NOTE — Telephone Encounter (Signed)
 Noted

## 2024-02-12 DIAGNOSIS — L820 Inflamed seborrheic keratosis: Secondary | ICD-10-CM | POA: Diagnosis not present

## 2024-02-12 DIAGNOSIS — D235 Other benign neoplasm of skin of trunk: Secondary | ICD-10-CM | POA: Diagnosis not present

## 2024-02-12 DIAGNOSIS — L821 Other seborrheic keratosis: Secondary | ICD-10-CM | POA: Diagnosis not present

## 2024-02-12 DIAGNOSIS — L570 Actinic keratosis: Secondary | ICD-10-CM | POA: Diagnosis not present

## 2024-02-12 DIAGNOSIS — Z129 Encounter for screening for malignant neoplasm, site unspecified: Secondary | ICD-10-CM | POA: Diagnosis not present

## 2024-02-12 DIAGNOSIS — Z85828 Personal history of other malignant neoplasm of skin: Secondary | ICD-10-CM | POA: Diagnosis not present

## 2024-02-13 ENCOUNTER — Ambulatory Visit (INDEPENDENT_AMBULATORY_CARE_PROVIDER_SITE_OTHER): Admitting: Family Medicine

## 2024-02-13 ENCOUNTER — Encounter: Payer: Self-pay | Admitting: Family Medicine

## 2024-02-13 ENCOUNTER — Ambulatory Visit (INDEPENDENT_AMBULATORY_CARE_PROVIDER_SITE_OTHER)

## 2024-02-13 VITALS — BP 112/72 | HR 65 | Ht 61.42 in | Wt 147.0 lb

## 2024-02-13 DIAGNOSIS — M79604 Pain in right leg: Secondary | ICD-10-CM | POA: Diagnosis not present

## 2024-02-13 DIAGNOSIS — M5416 Radiculopathy, lumbar region: Secondary | ICD-10-CM

## 2024-02-13 DIAGNOSIS — M25551 Pain in right hip: Secondary | ICD-10-CM | POA: Diagnosis not present

## 2024-02-13 DIAGNOSIS — M51369 Other intervertebral disc degeneration, lumbar region without mention of lumbar back pain or lower extremity pain: Secondary | ICD-10-CM | POA: Diagnosis not present

## 2024-02-13 MED ORDER — GABAPENTIN 100 MG PO CAPS: 100.0000 mg | ORAL_CAPSULE | Freq: Three times a day (TID) | ORAL | 2 refills | Status: DC | PRN

## 2024-02-13 NOTE — Progress Notes (Signed)
   I, Leotis Batter, CMA acting as a scribe for Artist Lloyd, MD.  Heather Adams is a 73 y.o. female who presents to [facility] today for R leg pain ongoing since mid-Aug. No injury/falls. She notes walking up to 2-miles/day. Pt locates pain to lower right leg, lateral aspect. Overtime, pain has started traveling up the leg to the knee. Sx worse after walking and tends to flare up after noon. Notes some tightness in the hamstring but that has resolved. Note TTP at lateral hip, hx of hip replacement. Over the past few days, having more pain in the lower back/gluteal region. Notes n/t and bug like sensation in the leg and toes. Denies visible swelling in the knee or ankle.  Radiates: low back, hip, leg Aggravates: walking on concrete, ambulation, recumbent bike Treatments tried: Tylenol, ice  Pertinent review of systems: No fevers or chills  Relevant historical information: Hypertension History of bilateral hip replacement.  Exam:  BP 112/72   Pulse 65   Ht 5' 1.42 (1.56 m)   Wt 147 lb (66.7 kg)   SpO2 96%   BMI 27.40 kg/m  General: Well Developed, well nourished, and in no acute distress.   MSK: L-spine: Normal appearing Normal motion.   Lower extremity strength is intact. Reflexes are intact.  Right lateral hip tender palpation greater trochanter normal motion.    Lab and Radiology Results  X-ray images lumbar spine obtained today personally and independently interpreted. 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  No acute fractures.  Bilateral hip replacement. Await formal radiology review     Assessment and Plan: 73 y.o. female with right leg pain most consistent with L5 lumbar radiculopathy.  Additionally she has some lateral hip pain that is most consistent with greater trochanteric bursitis/tendinopathy.  Plan for physical therapy and limited gabapentin .  If not improving consider lumbar spine MRI.  Recheck in 6 weeks  Of note for the MRI she was told that  she may not be helpful to have an MRI with her hip replacement.  This is not typical and we will need to double check before we order an MRI. PDMP not reviewed this encounter. Orders Placed This Encounter  Procedures   DG Lumbar Spine 2-3 Views    Standing Status:   Future    Number of Occurrences:   1    Expiration Date:   03/14/2024    Reason for Exam (SYMPTOM  OR DIAGNOSIS REQUIRED):   low back pain    Preferred imaging location?:   [facility]   Ambulatory referral to Physical Therapy    Referral Priority:   Routine    Referral Type:   Physical Medicine    Referral Reason:   Specialty Services Required    Requested Specialty:   Physical Therapy    Number of Visits Requested:   1   Meds ordered this encounter  Medications   gabapentin  (NEURONTIN ) 100 MG capsule    Sig: Take 1-3 capsules (100-300 mg total) by mouth 3 (three) times daily as needed.    Dispense:  90 capsule    Refill:  2     Discussed warning signs or symptoms. Please see discharge instructions. Patient expresses understanding.   The above documentation has been reviewed and is accurate and complete Artist Lloyd, M.D.

## 2024-02-13 NOTE — Patient Instructions (Addendum)
 Thank you for coming in today.   Please get an Xray today before you leave   Prescription sent in for Gabapentin  to take 100-300 mg up to three times daily as needed, especially before bed.   A referral for physical therapy has been submitted. A representative from the physical therapy office will contact you to coordinate scheduling after confirming your benefits with your insurance provider. If you do not hear from the physical therapy office within the next 1-2 weeks, please let us  know.  See you back in 6 weeks.

## 2024-02-21 ENCOUNTER — Ambulatory Visit: Payer: Self-pay | Admitting: Family Medicine

## 2024-02-21 NOTE — Progress Notes (Signed)
 X-ray of the low back shows medium arthritis at the base of the spine.

## 2024-02-24 ENCOUNTER — Ambulatory Visit

## 2024-02-25 ENCOUNTER — Ambulatory Visit (INDEPENDENT_AMBULATORY_CARE_PROVIDER_SITE_OTHER)

## 2024-02-25 VITALS — Ht 61.42 in | Wt 147.0 lb

## 2024-02-25 DIAGNOSIS — Z0000 Encounter for general adult medical examination without abnormal findings: Secondary | ICD-10-CM

## 2024-02-25 NOTE — Progress Notes (Signed)
 Subjective:   Heather Adams is a 73 y.o. who presents for a Medicare Wellness preventive visit.  As a reminder, Annual Wellness Visits don't include a physical exam, and some assessments may be limited, especially if this visit is performed virtually. We may recommend an in-person follow-up visit with your provider if needed.  Visit Complete: Virtual I connected with  [name] on 02/25/24 by a audio enabled telemedicine application and verified that I am speaking with the correct person using two identifiers.  Patient Location: Home  Provider Location: Home Office  I discussed the limitations of evaluation and management by telemedicine. The patient expressed understanding and agreed to proceed.  Vital Signs: Because this visit was a virtual/telehealth visit, some criteria may be missing or patient reported. Any vitals not documented were not able to be obtained and vitals that have been documented are patient reported.  VideoDeclined- This patient declined Librarian, academic. Therefore the visit was completed with audio only.  Persons Participating in Visit: Patient.  AWV Questionnaire: No: Patient Medicare AWV questionnaire was not completed prior to this visit.  Cardiac Risk Factors include: advanced age (>65men, >28 women);hypertension;dyslipidemia     Objective:    Today's Vitals   02/25/24 1043  Weight: 147 lb (66.7 kg)  Height: 5' 1.42 (1.56 m)   Body mass index is 27.4 kg/m.     02/25/2024   10:55 AM 02/20/2023    1:55 PM 02/15/2022    8:37 AM  Advanced Directives  Does Patient Have a Medical Advance Directive? Yes Yes Yes  Type of Estate agent of [place];Living will Healthcare Power of [place];Living will Healthcare Power of [place];Living will  Copy of Healthcare Power of Attorney in Chart? No - copy requested No - copy requested No - copy requested    Current Medications (verified) Outpatient Encounter  Medications as of 02/25/2024  Medication Sig   amLODipine  (NORVASC ) 5 MG tablet Take 1 tablet (5 mg total) by mouth daily.   atorvastatin  (LIPITOR) 20 MG tablet TAKE 1 TABLET BY MOUTH DAILY   Calcium  Carbonate-Vit D-Min (CALTRATE 600+D PLUS) 600-400 MG-UNIT per tablet Take 1 tablet by mouth daily.   Cetirizine HCl (ZYRTEC ALLERGY) 10 MG CAPS    famotidine  (PEPCID ) 20 MG tablet Pepcid    fluticasone (FLONASE) 50 MCG/ACT nasal spray Place 1 spray into both nostrils as needed for allergies or rhinitis.   gabapentin  (NEURONTIN ) 100 MG capsule Take 1-3 capsules (100-300 mg total) by mouth 3 (three) times daily as needed.   Glucosamine-Chondroit-Vit C-Mn (GLUCOSAMINE 1500 COMPLEX PO) Glucosamine   losartan  (COZAAR ) 50 MG tablet Take 1 tablet (50 mg total) by mouth daily.   No facility-administered encounter medications on file as of 02/25/2024.    Allergies (verified) Thimerosal (thiomersal)   History: Past Medical History:  Diagnosis Date   Fainting spell    with blood draws    Hyperlipidemia    Osteoarthritis of hip    right   Past Surgical History:  Procedure Laterality Date   COLONOSCOPY  01-17-12   per Dr. Alyce Lesches in [place], hemorrhoids, no polyps, repeat in 5 yrs    endometrosis  1986   Hemmorriod sugery  04-04-2015   hisogram  1986   TONSILLECTOMY  1977   TOTAL HIP ARTHROPLASTY  2010,2011   Family History  Problem Relation Age of Onset   Hypertension Mother    Heart disease Mother 31       CHF   Hyperlipidemia Mother  Mental retardation Sister    Hearing loss Paternal Aunt    Hearing loss Paternal Uncle    Heart disease Paternal Grandfather 46   Hearing loss Paternal Grandfather    Social History   Socioeconomic History   Marital status: Married    Spouse name: Doug   Number of children: 2   Years of education: Not on file   Highest education level: Associate degree: academic program  Occupational History   Occupation: RETIRED  Tobacco Use    Smoking status: Never   Smokeless tobacco: Never  Vaping Use   Vaping status: Never Used  Substance and Sexual Activity   Alcohol use: Yes    Comment: rare   Drug use: No   Sexual activity: Not on file  Other Topics Concern   Not on file  Social History Narrative   Lives with husband/2025   Social Drivers of Health   Financial Resource Strain: Low Risk  (02/25/2024)   Overall Financial Resource Strain (CARDIA)    Difficulty of Paying Living Expenses: Not hard at all  Food Insecurity: No Food Insecurity (02/25/2024)   Hunger Vital Sign    Worried About Running Out of Food in the Last Year: Never true    Ran Out of Food in the Last Year: Never true  Transportation Needs: No Transportation Needs (02/25/2024)   PRAPARE - Administrator, Civil Service (Medical): No    Lack of Transportation (Non-Medical): No  Physical Activity: Inactive (02/25/2024)   Exercise Vital Sign    Days of Exercise per Week: 0 days    Minutes of Exercise per Session: 0 min  Stress: No Stress Concern Present (02/25/2024)   [employer] of Occupational Health - Occupational Stress Questionnaire    Feeling of Stress: Not at all  Social Connections: Moderately Isolated (02/25/2024)   Social Connection and Isolation Panel    Frequency of Communication with Friends and Family: More than three times a week    Frequency of Social Gatherings with Friends and Family: Twice a week    Attends Religious Services: Never    Database administrator or Organizations: No    Attends Engineer, structural: Never    Marital Status: Married    Tobacco Counseling Counseling given: Not Answered    Clinical Intake:  Pre-visit preparation completed: Yes  Pain : No/denies pain     BMI - recorded: 27.4 Nutritional Status: BMI 25 -29 Overweight Nutritional Risks: None Diabetes: No  No results found for: HGBA1C   How often do you need to have someone help you when you read instructions,  pamphlets, or other written materials from your doctor or pharmacy?: 1 - Never  Interpreter Needed?: No  Information entered by :: Mayjor Ager, RMA   Activities of Daily Living     02/25/2024   10:45 AM  In your present state of health, do you have any difficulty performing the following activities:  Hearing? 0  Vision? 0  Difficulty concentrating or making decisions? 0  Walking or climbing stairs? 0  Dressing or bathing? 0  Doing errands, shopping? 0  Preparing Food and eating ? N  Using the Toilet? N  In the past six months, have you accidently leaked urine? N  Do you have problems with loss of bowel control? N  Managing your Medications? N  Managing your Finances? N  Housekeeping or managing your Housekeeping? N    Patient Care Team: Micheal Wolm ORN, MD as PCP - General  I have updated your Care Teams any recent Medical Services you may have received from other providers in the past year.     Assessment:   This is a routine wellness examination for [place].  Hearing/Vision screen Hearing Screening - Comments:: Denies hearing difficulties   Vision Screening - Comments:: Denies vision issues./Dr. Brain Gavilanes/[facility] in [place]    Goals Addressed               This Visit's Progress     Patient Stated (pt-stated)   On track     I'm trying to lose a little weight.       Depression Screen     02/25/2024   10:59 AM 02/20/2023    1:53 PM 02/15/2022    8:35 AM 02/02/2020    7:58 AM 01/30/2019    7:29 AM 01/17/2018    9:35 AM 01/14/2017   10:07 AM  PHQ 2/9 Scores  PHQ - 2 Score 3 0 0 0 0 0 0  PHQ- 9 Score 3    0      Fall Risk     02/25/2024   10:56 AM 02/20/2023    1:54 PM 02/15/2022    8:36 AM 02/13/2021    8:30 AM 02/02/2020    7:58 AM  Fall Risk   Falls in the past year? 0 1 0 0 0  Number falls in past yr: 0 0 0  0  Injury with Fall? 0 0 0  0  Risk for fall due to :  No Fall Risks No Fall Risks    Follow up Falls evaluation completed;Falls  prevention discussed Falls prevention discussed Falls prevention discussed        Data saved with a previous flowsheet row definition    MEDICARE RISK AT HOME:  Medicare Risk at Home Any stairs in or around the home?: Yes (has an upstairs) If so, are there any without handrails?: No Home free of loose throw rugs in walkways, pet beds, electrical cords, etc?: Yes Adequate lighting in your home to reduce risk of falls?: Yes Life alert?: No Use of a cane, walker or w/c?: No Grab bars in the bathroom?: Yes Shower chair or bench in shower?: Yes Elevated toilet seat or a handicapped toilet?: Yes  TIMED UP AND GO:  Was the test performed?  No  Cognitive Function: Declined/Normal: No cognitive concerns noted by patient or family. Patient alert, oriented, able to answer questions appropriately and recall recent events. No signs of memory loss or confusion.        02/20/2023    1:55 PM 02/15/2022    8:38 AM  6CIT Screen  What Year? 0 points 0 points  What month? 0 points 0 points  What time? 0 points 0 points  Count back from 20 0 points 0 points  Months in reverse 0 points 0 points  Repeat phrase 0 points 0 points  Total Score 0 points 0 points    Immunizations Immunization History  Administered Date(s) Administered   Fluad Quad(high Dose 65+) 02/23/2022   INFLUENZA, HIGH DOSE SEASONAL PF 03/21/2016, 03/02/2019   Influenza Split 03/11/2012, 03/23/2013   Influenza,inj,Quad PF,6+ Mos 04/02/2014, 03/09/2015, 03/22/2017, 02/24/2018   Influenza-Unspecified 03/02/2019   PFIZER(Purple Top)SARS-COV-2 Vaccination 07/28/2019, 08/18/2019   Pneumococcal Conjugate-13 01/17/2018   Pneumococcal Polysaccharide-23 01/30/2019   Td 06/05/1999   Tdap 10/02/2010   Zoster Recombinant(Shingrix ) 04/18/2017, 06/20/2017   Zoster, Live 10/09/2012    Screening Tests Health Maintenance  Topic Date Due  DTaP/Tdap/Td (3 - Td or Tdap) 10/01/2020   Mammogram  04/08/2022   Influenza Vaccine   01/03/2024   COVID-19 Vaccine (3 - 2025-26 season) 02/03/2024   Medicare Annual Wellness (AWV)  02/20/2024   Colonoscopy  07/05/2032   Pneumococcal Vaccine: 50+ Years  Completed   DEXA SCAN  Completed   Hepatitis C Screening  Completed   Zoster Vaccines- Shingrix   Completed   HPV VACCINES  Aged Out   Meningococcal B Vaccine  Aged Out    Health Maintenance Items Addressed: Mammogram scheduled, See Nurse Notes at the end of this note  Additional Screening:  Vision Screening: Recommended annual ophthalmology exams for early detection of glaucoma and other disorders of the eye. Is the patient up to date with their annual eye exam?  No  Who is the provider or what is the name of the office in which the patient attends annual eye exams? Dr. Campbell Beske/[facility] in [place]  Dental Screening: Recommended annual dental exams for proper oral hygiene  Community Resource Referral / Chronic Care Management: CRR required this visit?  No   CCM required this visit?  No   Plan:    I have personally reviewed and noted the following in the patient's chart:   Medical and social history Use of alcohol, tobacco or illicit drugs  Current medications and supplements including opioid prescriptions. Patient is not currently taking opioid prescriptions. Functional ability and status Nutritional status Physical activity Advanced directives List of other physicians Hospitalizations, surgeries, and ER visits in previous 12 months Vitals Screenings to include cognitive, depression, and falls Referrals and appointments  In addition, I have reviewed and discussed with patient certain preventive protocols, quality metrics, and best practice recommendations. A written personalized care plan for preventive services as well as general preventive health recommendations were provided to patient.   Starlee Corralejo L Bronislaus Verdell, CMA   02/25/2024   After Visit Summary: (MyChart) Due to this being a telephonic  visit, the after visit summary with patients personalized plan was offered to patient via MyChart   Notes: Patient is due for a tdap and a flu vaccine, which she stated that she may wait to get Flu vaccine until her up coming office visit.  Patient is due for a DEXA, however, she stated that she has an appoint to see her GYN and will ask them to place order for DEXA.  Patient also stated that she is scheduled for a mammogram in October 2025.  She had no other concerns to address today.

## 2024-02-25 NOTE — Patient Instructions (Signed)
 Heather Adams,  Thank you for taking the time for your Medicare Wellness Visit. I appreciate your continued commitment to your health goals. Please review the care plan we discussed, and feel free to reach out if I can assist you further.  Medicare recommends these wellness visits once per year to help you and your care team stay ahead of potential health issues. These visits are designed to focus on prevention, allowing your provider to concentrate on managing your acute and chronic conditions during your regular appointments.  Please note that Annual Wellness Visits do not include a physical exam. Some assessments may be limited, especially if the visit was conducted virtually. If needed, we may recommend a separate in-person follow-up with your provider.  Ongoing Care Seeing your primary care provider every 3 to 6 months helps us  monitor your health and provide consistent, personalized care. Next office visit on 03/25/2024.  You are due for Flu vaccine and a tetanus vaccine.    Referrals If a referral was made during today's visit and you haven't received any updates within two weeks, please contact the referred provider directly to check on the status.  Recommended Screenings:  Health Maintenance  Topic Date Due   DTaP/Tdap/Td vaccine (3 - Td or Tdap) 10/01/2020   Breast Cancer Screening  04/08/2022   Flu Shot  01/03/2024   COVID-19 Vaccine (3 - 2025-26 season) 02/03/2024   Medicare Annual Wellness Visit  02/20/2024   Colon Cancer Screening  07/05/2032   Pneumococcal Vaccine for age over 48  Completed   DEXA scan (bone density measurement)  Completed   Hepatitis C Screening  Completed   Zoster (Shingles) Vaccine  Completed   HPV Vaccine  Aged Out   Meningitis B Vaccine  Aged Out       02/25/2024   10:55 AM  Advanced Directives  Does Patient Have a Medical Advance Directive? Yes  Type of Estate agent of [place];Living will  Copy of Healthcare Power of  Attorney in Chart? No - copy requested   Advance Care Planning is important because it: Ensures you receive medical care that aligns with your values, goals, and preferences. Provides guidance to your family and loved ones, reducing the emotional burden of decision-making during critical moments.  Vision: Annual vision screenings are recommended for early detection of glaucoma, cataracts, and diabetic retinopathy. These exams can also reveal signs of chronic conditions such as diabetes and high blood pressure.  Dental: Annual dental screenings help detect early signs of oral cancer, gum disease, and other conditions linked to overall health, including heart disease and diabetes.  Please see the attached documents for additional preventive care recommendations.

## 2024-03-02 ENCOUNTER — Encounter: Payer: Self-pay | Admitting: Physical Therapy

## 2024-03-02 ENCOUNTER — Ambulatory Visit (INDEPENDENT_AMBULATORY_CARE_PROVIDER_SITE_OTHER): Admitting: Physical Therapy

## 2024-03-02 DIAGNOSIS — M5416 Radiculopathy, lumbar region: Secondary | ICD-10-CM | POA: Diagnosis not present

## 2024-03-02 MED ORDER — PREDNISONE 50 MG PO TABS: 50.0000 mg | ORAL_TABLET | Freq: Every day | ORAL | 0 refills | Status: DC

## 2024-03-02 NOTE — Telephone Encounter (Signed)
 Forwarding to Dr. Denyse Amass to review and advise.

## 2024-03-02 NOTE — Therapy (Signed)
 OUTPATIENT PHYSICAL THERAPY LOWER EXTREMITY EVALUATION   Patient Name: Heather Adams MRN: [id number] DOB:01/22/1951, 73 y.o., female Today's Date: 03/02/2024  END OF SESSION:  PT End of Session - 03/02/24 1205     Visit Number 1    Number of Visits 16    Date for Recertification  04/27/24    Authorization Type Medicare    PT Start Time 1210    PT Stop Time 1255    PT Time Calculation (min) 45 min    Activity Tolerance Patient tolerated treatment well    Behavior During Therapy [facility] for tasks assessed/performed          Past Medical History:  Diagnosis Date   Fainting spell    with blood draws    Hyperlipidemia    Osteoarthritis of hip    right   Past Surgical History:  Procedure Laterality Date   COLONOSCOPY  01-17-12   per Dr. Alyce Lesches in [place], hemorrhoids, no polyps, repeat in 5 yrs    endometrosis  1986   Hemmorriod sugery  04-04-2015   hisogram  1986   TONSILLECTOMY  1977   TOTAL HIP ARTHROPLASTY  7989,7988   Patient Active Problem List   Diagnosis Date Noted   Essential hypertension 05/20/2019   ESOPHAGEAL REFLUX 02/02/2010   Dyslipidemia 09/22/2008   OSTEOARTHRITIS, HIPS, BILATERAL 09/22/2008     PCP: Wolm Scarlet  REFERRING PROVIDER: Artist Lloyd   REFERRING DIAG: Lumbar radiculopathy  THERAPY DIAG:  Radiculopathy, lumbar region  Rationale for Evaluation and Treatment: Rehabilitation  ONSET DATE:    SUBJECTIVE:   SUBJECTIVE STATEMENT: Pt states Increased pain in R LE, at least 1 month. Initially felt sensation at R lower/lateral ankle. R knee also sore at times. She has Bil hip replacements and notes some pain in R lateral hip at times. Is having more frequent pain in R glute. Feels pretty good in the morning. Pain increasing as she is walking longer distances.   PERTINENT HISTORY:  OA, bil hip replacements,     PAIN:  Are you having pain? Yes: NPRS scale: 4-6/10 Pain location: R hip, glute, into LE Pain description: sore,   Aggravating factors: increased walking  Relieving factors: none stated     PRECAUTIONS: None   WEIGHT BEARING RESTRICTIONS: No   FALLS:  Has patient fallen in last 6 months? No   PLOF: Independent  PATIENT GOALS:  Decreased pain in hip/back, leg  NEXT MD VISIT:   OBJECTIVE:   DIAGNOSTIC FINDINGS:   PATIENT SURVEYS:    COGNITION: Overall cognitive status: Within functional limits for tasks assessed     SENSATION: WFL  EDEMA:    POSTURE:   Standing : R hip higher,  Supine: likely R LE longer, L shorter    PALPATION: Tenderness in R glute, along sacral border , into piriformis, mild soreness in Lateral hip R>L .  LOWER EXTREMITY ROM:  MMT/strength Left eval Right eval  Hip flexion 4+ 4- (pain with SLR/strength test)   Hip extension    Hip abduction  4-   Hip adduction    Hip internal rotation    Hip external rotation  4-  Knee flexion  5  Knee extension  5  Ankle dorsiflexion    Ankle plantarflexion    Ankle inversion    Ankle eversion     (Blank rows = not tested)  LOWER EXTREMITY ROM Lumbar: mild limitation for flex, mod for ext. Mod for R SB Hip ROM: minimal limitations.  Knees: WFL.  LOWER EXTREMITY SPECIAL TESTS:    FUNCTIONAL TESTS:  Placed lift under L foot in standing- leg length appears even with lift.    GAIT: Distance walked: 100 ft  Assistive device utilized: None Level of assistance: Complete Independence Comments: uneven appearing leg length   TODAY'S TREATMENT:                                                                                                                              DATE:   Eval: ther ex: see below for HEP SLR and hip clams- improved comfort with repeated repetitions and better muscle recruitment. SLR initially painful in sacrum and glute.   PATIENT EDUCATION:  Education details: PT POC, Exam findings, HEP Person educated: Patient Education method: Explanation, Demonstration, Tactile cues,  Verbal cues, and Handouts Education comprehension: verbalized understanding, returned demonstration, verbal cues required, tactile cues required, and needs further education   HOME EXERCISE PROGRAM: Access Code: [email or web address] URL: [email or web address] Date: 03/02/2024 Prepared by: Tinnie Don  Exercises - Supine Piriformis Stretch Pulling Heel to Hip  - 1-2 x daily - 3 reps - 20 hold - Supine Piriformis Stretch with Leg Straight  - 2 x daily - 3 reps - 20 hold - Clamshell  - 1 x daily - 1-2 sets - 10 reps - Straight Leg Raise  - 1 x daily - 3-4 x weekly - 1-2 sets - 5-10 reps   ASSESSMENT:  CLINICAL IMPRESSION: Patient presents with primary complaint of  primary complaint of pain in R glute and hip. She has most tenderness in R glute and along sacral border. She does have quite a bit of weakness in R hip vs L, with increased/reproduced pain with movement.  She also has appearing leg length deficit, may consider lift to L side in future. Pt will benefit from education on HEP for back and hip pain. Pt with decreased ability for full functional activities. Pt will  benefit from skilled PT to improve deficits and pain and to return to PLOF.   OBJECTIVE IMPAIRMENTS: Abnormal gait, decreased activity tolerance, decreased mobility, decreased strength, increased muscle spasms, improper body mechanics, postural dysfunction, and pain.   ACTIVITY LIMITATIONS: lifting, bending, standing, stairs, reach over head, and locomotion level  PARTICIPATION LIMITATIONS: cleaning, laundry, shopping, and community activity  PERSONAL FACTORS: Past/current experiences, Time since onset of injury/illness/exacerbation, and 1 comorbidity: hip replacement, possible leg length deficit  are also affecting patient's functional outcome.   REHAB POTENTIAL: Good  CLINICAL DECISION MAKING: Stable/uncomplicated  EVALUATION COMPLEXITY: Low   GOALS: Goals reviewed with patient? Yes  SHORT TERM GOALS:  Target date: 03/23/2024   Pt to be independent with HEP  Goal status: INITIAL  2.  Pt to demo ability for active TA contraction for stabilization of core   Goal status: INITIAL   LONG TERM GOALS: Target date: 04/27/2024   Pt to be independent with final HEP  Goal status: INITIAL  2.  Pt to demo improved  strength of R hip, to at least 4/5, and ability for SLR without pain in hip or back.  Goal status: INITIAL  3.  Pt to demo ability for walking up to 2 miles without pain in hip/back/LE.   Goal status: INITIAL  4.  Pt to report decreased pain in R glute/LE to 0-2/10 with activity.  Goal status: INITIAL    PLAN:  PT FREQUENCY: 1-2x/week  PT DURATION: 8 weeks  PLANNED INTERVENTIONS: Therapeutic exercises, Therapeutic activity, Neuromuscular re-education, Patient/Family education, Self Care, Joint mobilization, Joint manipulation, Stair training, Orthotic/Fit training, DME instructions, Aquatic Therapy, Dry Needling, Electrical stimulation, Cryotherapy, Moist heat, Taping, Ultrasound, Ionotophoresis 4mg /ml Dexamethasone, Manual therapy,  Vasopneumatic device, Traction, Spinal manipulation, Spinal mobilization,Balance training, Gait training,   PLAN FOR NEXT SESSION: outcome measure , possible heel lift    Tinnie Don, PT, DPT 3:23 PM  03/02/24

## 2024-03-10 ENCOUNTER — Encounter: Payer: Self-pay | Admitting: Physical Therapy

## 2024-03-10 ENCOUNTER — Ambulatory Visit: Admitting: Physical Therapy

## 2024-03-10 DIAGNOSIS — M5416 Radiculopathy, lumbar region: Secondary | ICD-10-CM | POA: Diagnosis not present

## 2024-03-10 NOTE — Therapy (Unsigned)
 OUTPATIENT PHYSICAL THERAPY LOWER EXTREMITY TREATMENT   Patient Name: Nannette Zill MRN: [id number] DOB:12-01-1950, 73 y.o., female Today's Date: 03/10/2024  END OF SESSION:  PT End of Session - 03/10/24 0929     Visit Number 2    Number of Visits 16    Date for Recertification  04/27/24    Authorization Type Medicare    PT Start Time 0930    PT Stop Time 1015    PT Time Calculation (min) 45 min    Activity Tolerance Patient tolerated treatment well    Behavior During Therapy [facility] for tasks assessed/performed          Past Medical History:  Diagnosis Date   Fainting spell    with blood draws    Hyperlipidemia    Osteoarthritis of hip    right   Past Surgical History:  Procedure Laterality Date   COLONOSCOPY  01-17-12   per Dr. Alyce Lesches in [place], hemorrhoids, no polyps, repeat in 5 yrs    endometrosis  1986   Hemmorriod sugery  04-04-2015   hisogram  1986   TONSILLECTOMY  1977   TOTAL HIP ARTHROPLASTY  7989,7988   Patient Active Problem List   Diagnosis Date Noted   Essential hypertension 05/20/2019   ESOPHAGEAL REFLUX 02/02/2010   Dyslipidemia 09/22/2008   OSTEOARTHRITIS, HIPS, BILATERAL 09/22/2008     PCP: Wolm Scarlet  REFERRING PROVIDER: Artist Lloyd   REFERRING DIAG: Lumbar radiculopathy  THERAPY DIAG:  Radiculopathy, lumbar region  Rationale for Evaluation and Treatment: Rehabilitation  ONSET DATE:    SUBJECTIVE:   SUBJECTIVE STATEMENT: Sleeping better. Taking less advil. Feels some improvement. Glute pain less, but still sore in lower leg.    Pt states Increased pain in R LE, at least 1 month. Initially felt sensation at R lower/lateral ankle. R knee also sore at times. She has Bil hip replacements and notes some pain in R lateral hip at times. Is having more frequent pain in R glute. Feels pretty good in the morning. Pain increasing as she is walking longer distances.   PERTINENT HISTORY:  OA, bil hip replacements,     PAIN:   Are you having pain? Yes: NPRS scale: 4-6/10 Pain location: R hip, glute, into LE Pain description: sore,  Aggravating factors: increased walking  Relieving factors: none stated     PRECAUTIONS: None   WEIGHT BEARING RESTRICTIONS: No   FALLS:  Has patient fallen in last 6 months? No   PLOF: Independent  PATIENT GOALS:  Decreased pain in hip/back, leg  NEXT MD VISIT:   OBJECTIVE:   DIAGNOSTIC FINDINGS:   PATIENT SURVEYS:   Mod Oswestry:  12/50  = 24 % disability    COGNITION: Overall cognitive status: Within functional limits for tasks assessed     SENSATION: WFL  EDEMA:    POSTURE:   Standing : R hip higher,  Supine: likely R LE longer, L shorter    PALPATION: Tenderness in R glute, along sacral border , into piriformis, mild soreness in Lateral hip R>L .  LOWER EXTREMITY ROM:  MMT/strength Left eval Right eval  Hip flexion 4+ 4- (pain with SLR/strength test)   Hip extension    Hip abduction  4-   Hip adduction    Hip internal rotation    Hip external rotation  4-  Knee flexion  5  Knee extension  5  Ankle dorsiflexion    Ankle plantarflexion    Ankle inversion    Ankle eversion     (  Blank rows = not tested)  LOWER EXTREMITY ROM Lumbar: mild limitation for flex, mod for ext. Mod for R SB Hip ROM: minimal limitations.  Knees: WFL.   LOWER EXTREMITY SPECIAL TESTS:    FUNCTIONAL TESTS:  Placed lift under L foot in standing- leg length appears even with lift.    GAIT: Distance walked: 100 ft  Assistive device utilized: None Level of assistance: Complete Independence Comments: uneven appearing leg length   TODAY'S TREATMENT:                                                                                                                              DATE:   03/10/2024 Therapeutic Exercise: Aerobic: Supine:   bridging x 10 ;  SLR 2 x 10 bil;  S/L:  clams 2 x 10 on R;  Seated: Standing: Stretches:   piriformis 30 sec x 3 on R ;    Hip IR across body stretch x 3 on R;  Neuromuscular Re-education: heel lift placed in L shoe- pt given instructions on wear time, use and precautions, fitting in shoe. More even illiac crests and posture with addition of lift- good comfort with trial today, walking 2 full loops( 350 ft)  Manual Therapy:   LAD on R for lumbar distraction ; STM/tennis ball to R glute and sacral border Therapeutic Activity: Self Care:    Eval: ther ex: see below for HEP SLR and hip clams- improved comfort with repeated repetitions and better muscle recruitment. SLR initially painful in sacrum and glute.   PATIENT EDUCATION:  Education details:  updated and reviewed HEP Person educated: Patient Education method: Explanation, Demonstration, Tactile cues, Verbal cues, and Handouts Education comprehension: verbalized understanding, returned demonstration, verbal cues required, tactile cues required, and needs further education   HOME EXERCISE PROGRAM: Access Code: [email or web address] URL: [email or web address] Date: 03/02/2024 Prepared by: Tinnie Don  Exercises - Supine Piriformis Stretch Pulling Heel to Hip  - 1-2 x daily - 3 reps - 20 hold - Supine Piriformis Stretch with Leg Straight  - 2 x daily - 3 reps - 20 hold - Clamshell  - 1 x daily - 1-2 sets - 10 reps - Straight Leg Raise  - 1 x daily - 3-4 x weekly - 1-2 sets - 5-10 reps   ASSESSMENT:  CLINICAL IMPRESSION: Pt with good comfort and improved leg length with addition of heel lift today, will continue to monitor for improving back/hip pain. She has tenderness in R glute, addressed with manual today, and educated on strength for R hip. Will continue to benefit from manual and strengthening as tolerated.   Eval: Patient presents with primary complaint of  primary complaint of pain in R glute and hip. She has most tenderness in R glute and along sacral border. She does have quite a bit of weakness in R hip vs L, with increased/reproduced  pain with movement.  She also has appearing leg length deficit, may consider lift to  L side in future. Pt will benefit from education on HEP for back and hip pain. Pt with decreased ability for full functional activities. Pt will  benefit from skilled PT to improve deficits and pain and to return to PLOF.   OBJECTIVE IMPAIRMENTS: Abnormal gait, decreased activity tolerance, decreased mobility, decreased strength, increased muscle spasms, improper body mechanics, postural dysfunction, and pain.   ACTIVITY LIMITATIONS: lifting, bending, standing, stairs, reach over head, and locomotion level  PARTICIPATION LIMITATIONS: cleaning, laundry, shopping, and community activity  PERSONAL FACTORS: Past/current experiences, Time since onset of injury/illness/exacerbation, and 1 comorbidity: hip replacement, possible leg length deficit  are also affecting patient's functional outcome.   REHAB POTENTIAL: Good  CLINICAL DECISION MAKING: Stable/uncomplicated  EVALUATION COMPLEXITY: Low   GOALS: Goals reviewed with patient? Yes  SHORT TERM GOALS: Target date: 03/23/2024   Pt to be independent with HEP  Goal status: INITIAL  2.  Pt to demo ability for active TA contraction for stabilization of core   Goal status: INITIAL   LONG TERM GOALS: Target date: 04/27/2024   Pt to be independent with final HEP  Goal status: INITIAL  2.  Pt to demo improved strength of R hip, to at least 4/5, and ability for SLR without pain in hip or back.  Goal status: INITIAL  3.  Pt to demo ability for walking up to 2 miles without pain in hip/back/LE.   Goal status: INITIAL  4.  Pt to report decreased pain in R glute/LE to 0-2/10 with activity.  Goal status: INITIAL    PLAN:  PT FREQUENCY: 1-2x/week  PT DURATION: 8 weeks  PLANNED INTERVENTIONS: Therapeutic exercises, Therapeutic activity, Neuromuscular re-education, Patient/Family education, Self Care, Joint mobilization, Joint manipulation, Stair  training, Orthotic/Fit training, DME instructions, Aquatic Therapy, Dry Needling, Electrical stimulation, Cryotherapy, Moist heat, Taping, Ultrasound, Ionotophoresis 4mg /ml Dexamethasone, Manual therapy,  Vasopneumatic device, Traction, Spinal manipulation, Spinal mobilization,Balance training, Gait training,   PLAN FOR NEXT SESSION: assess  possible heel lift    Tinnie Don, PT, DPT 9:29 AM  03/10/24

## 2024-03-12 ENCOUNTER — Ambulatory Visit: Admitting: Physical Therapy

## 2024-03-12 ENCOUNTER — Encounter: Payer: Self-pay | Admitting: Physical Therapy

## 2024-03-12 DIAGNOSIS — M5416 Radiculopathy, lumbar region: Secondary | ICD-10-CM | POA: Diagnosis not present

## 2024-03-12 NOTE — Therapy (Signed)
 OUTPATIENT PHYSICAL THERAPY LOWER EXTREMITY TREATMENT   Patient Name: Heather Adams MRN: [id number] DOB:1950/07/05, 73 y.o., female Today's Date: 03/12/2024  END OF SESSION:  PT End of Session - 03/12/24 1008     Visit Number 3    Number of Visits 16    Date for Recertification  04/27/24    Authorization Type Medicare    PT Start Time 1010    PT Stop Time 1052    PT Time Calculation (min) 42 min    Activity Tolerance Patient tolerated treatment well    Behavior During Therapy WFL for tasks assessed/performed          Past Medical History:  Diagnosis Date   Fainting spell    with blood draws    Hyperlipidemia    Osteoarthritis of hip    right   Past Surgical History:  Procedure Laterality Date   COLONOSCOPY  01-17-12   per Dr. Alyce Lesches in [place], hemorrhoids, no polyps, repeat in 5 yrs    endometrosis  1986   Hemmorriod sugery  04-04-2015   hisogram  1986   TONSILLECTOMY  1977   TOTAL HIP ARTHROPLASTY  7989,7988   Patient Active Problem List   Diagnosis Date Noted   Essential hypertension 05/20/2019   ESOPHAGEAL REFLUX 02/02/2010   Dyslipidemia 09/22/2008   OSTEOARTHRITIS, HIPS, BILATERAL 09/22/2008     PCP: Wolm Scarlet  REFERRING PROVIDER: Artist Lloyd   REFERRING DIAG: Lumbar radiculopathy  THERAPY DIAG:  Radiculopathy, lumbar region  Rationale for Evaluation and Treatment: Rehabilitation  ONSET DATE:    SUBJECTIVE:   SUBJECTIVE STATEMENT: Pt wearing heel lift with good comfort, not noticing it in shoe. R glute and LE still sore.    Pt states Increased pain in R LE, at least 1 month. Initially felt sensation at R lower/lateral ankle. R knee also sore at times. She has Bil hip replacements and notes some pain in R lateral hip at times. Is having more frequent pain in R glute. Feels pretty good in the morning. Pain increasing as she is walking longer distances.   PERTINENT HISTORY:  OA, bil hip replacements,     PAIN:  Are you  having pain? Yes: NPRS scale: 4-6/10 Pain location: R hip, glute, into LE Pain description: sore,  Aggravating factors: increased walking  Relieving factors: none stated     PRECAUTIONS: None   WEIGHT BEARING RESTRICTIONS: No   FALLS:  Has patient fallen in last 6 months? No   PLOF: Independent  PATIENT GOALS:  Decreased pain in hip/back, leg  NEXT MD VISIT:   OBJECTIVE:   DIAGNOSTIC FINDINGS:   PATIENT SURVEYS:   Mod Oswestry:  12/50  = 24 % disability    COGNITION: Overall cognitive status: Within functional limits for tasks assessed     SENSATION: WFL  EDEMA:    POSTURE:   Standing : R hip higher,  Supine: likely R LE longer, L shorter    PALPATION: Tenderness in R glute, along sacral border , into piriformis, mild soreness in Lateral hip R>L .  LOWER EXTREMITY ROM:  MMT/strength Left eval Right eval  Hip flexion 4+ 4- (pain with SLR/strength test)   Hip extension    Hip abduction  4-   Hip adduction    Hip internal rotation    Hip external rotation  4-  Knee flexion  5  Knee extension  5  Ankle dorsiflexion    Ankle plantarflexion    Ankle inversion    Ankle eversion     (  Blank rows = not tested)  LOWER EXTREMITY ROM Lumbar: mild limitation for flex, mod for ext. Mod for R SB Hip ROM: minimal limitations.  Knees: WFL.   LOWER EXTREMITY SPECIAL TESTS:    FUNCTIONAL TESTS:  Placed lift under L foot in standing- leg length appears even with lift.    GAIT: Distance walked: 100 ft  Assistive device utilized: None Level of assistance: Complete Independence Comments: uneven appearing leg length   TODAY'S TREATMENT:                                                                                                                              DATE:   03/12/2024 Therapeutic Exercise: Aerobic:   bike L2 x 6 min  Supine:   bridging with GTB at thighs x 15 ;  SLR 2 x 10 bil;  clams GTB x 20- alternating with TA  S/L:  clams 2 x 10 on R;   Seated: Standing:  hip abd 2 x 10 bil; ( no pain)  Stretches:   piriformis 30 sec x 3 on R ;  Hip IR across body stretch x 3 on R;  Neuromuscular Re-education:  Manual Therapy:   LAD on R for lumbar distraction ; STM/DTM to R glute Therapeutic Activity: Step ups 6 in, light 1 UE support x 12 bil;  Slow march x 20;  Self Care:    Eval: ther ex: see below for HEP SLR and hip clams- improved comfort with repeated repetitions and better muscle recruitment. SLR initially painful in sacrum and glute.   PATIENT EDUCATION:  Education details:  updated and reviewed HEP Person educated: Patient Education method: Explanation, Demonstration, Tactile cues, Verbal cues, and Handouts Education comprehension: verbalized understanding, returned demonstration, verbal cues required, tactile cues required, and needs further education   HOME EXERCISE PROGRAM: Access Code: [email or web address]    ASSESSMENT:  CLINICAL IMPRESSION: Will leave heel lift in for another week or so to continue to assess effects for posture and pain. Pt wearing with good comfort at this time. Good ability for progression of standing ther ex/strength for hips today without increased pain. Plan to continue strength and manual as needed.   Eval: Patient presents with primary complaint of  primary complaint of pain in R glute and hip. She has most tenderness in R glute and along sacral border. She does have quite a bit of weakness in R hip vs L, with increased/reproduced pain with movement.  She also has appearing leg length deficit, may consider lift to L side in future. Pt will benefit from education on HEP for back and hip pain. Pt with decreased ability for full functional activities. Pt will  benefit from skilled PT to improve deficits and pain and to return to PLOF.   OBJECTIVE IMPAIRMENTS: Abnormal gait, decreased activity tolerance, decreased mobility, decreased strength, increased muscle spasms, improper body mechanics, postural  dysfunction, and pain.   ACTIVITY LIMITATIONS: lifting, bending, standing, stairs, reach over  head, and locomotion level  PARTICIPATION LIMITATIONS: cleaning, laundry, shopping, and community activity  PERSONAL FACTORS: Past/current experiences, Time since onset of injury/illness/exacerbation, and 1 comorbidity: hip replacement, possible leg length deficit  are also affecting patient's functional outcome.   REHAB POTENTIAL: Good  CLINICAL DECISION MAKING: Stable/uncomplicated  EVALUATION COMPLEXITY: Low   GOALS: Goals reviewed with patient? Yes  SHORT TERM GOALS: Target date: 03/23/2024   Pt to be independent with HEP  Goal status: INITIAL  2.  Pt to demo ability for active TA contraction for stabilization of core   Goal status: INITIAL   LONG TERM GOALS: Target date: 04/27/2024   Pt to be independent with final HEP  Goal status: INITIAL  2.  Pt to demo improved strength of R hip, to at least 4/5, and ability for SLR without pain in hip or back.  Goal status: INITIAL  3.  Pt to demo ability for walking up to 2 miles without pain in hip/back/LE.   Goal status: INITIAL  4.  Pt to report decreased pain in R glute/LE to 0-2/10 with activity.  Goal status: INITIAL    PLAN:  PT FREQUENCY: 1-2x/week  PT DURATION: 8 weeks  PLANNED INTERVENTIONS: Therapeutic exercises, Therapeutic activity, Neuromuscular re-education, Patient/Family education, Self Care, Joint mobilization, Joint manipulation, Stair training, Orthotic/Fit training, DME instructions, Aquatic Therapy, Dry Needling, Electrical stimulation, Cryotherapy, Moist heat, Taping, Ultrasound, Ionotophoresis 4mg /ml Dexamethasone, Manual therapy,  Vasopneumatic device, Traction, Spinal manipulation, Spinal mobilization,Balance training, Gait training,   PLAN FOR NEXT SESSION: assess  heel lift    Tinnie Don, PT, DPT 10:08 AM  03/12/24

## 2024-03-16 ENCOUNTER — Ambulatory Visit: Admitting: Physical Therapy

## 2024-03-16 ENCOUNTER — Encounter: Payer: Self-pay | Admitting: Physical Therapy

## 2024-03-16 DIAGNOSIS — M5416 Radiculopathy, lumbar region: Secondary | ICD-10-CM

## 2024-03-16 NOTE — Therapy (Signed)
 OUTPATIENT PHYSICAL THERAPY LOWER EXTREMITY TREATMENT   Patient Name: Minola Guin MRN: [id number] DOB:04-06-1951, 73 y.o., female Today's Date: 03/16/2024  END OF SESSION:  PT End of Session - 03/16/24 1057     Visit Number 4    Number of Visits 16    Date for Recertification  04/27/24    Authorization Type Medicare    PT Start Time 1020    PT Stop Time 1058    PT Time Calculation (min) 38 min    Activity Tolerance Patient tolerated treatment well    Behavior During Therapy WFL for tasks assessed/performed           Past Medical History:  Diagnosis Date   Fainting spell    with blood draws    Hyperlipidemia    Osteoarthritis of hip    right   Past Surgical History:  Procedure Laterality Date   COLONOSCOPY  01-17-12   per Dr. Alyce Lesches in [place], hemorrhoids, no polyps, repeat in 5 yrs    endometrosis  1986   Hemmorriod sugery  04-04-2015   hisogram  1986   TONSILLECTOMY  1977   TOTAL HIP ARTHROPLASTY  7989,7988   Patient Active Problem List   Diagnosis Date Noted   Essential hypertension 05/20/2019   ESOPHAGEAL REFLUX 02/02/2010   Dyslipidemia 09/22/2008   OSTEOARTHRITIS, HIPS, BILATERAL 09/22/2008     PCP: Wolm Scarlet  REFERRING PROVIDER: Artist Lloyd   REFERRING DIAG: Lumbar radiculopathy  THERAPY DIAG:  Radiculopathy, lumbar region  Rationale for Evaluation and Treatment: Rehabilitation  ONSET DATE:    SUBJECTIVE:   SUBJECTIVE STATEMENT: Pt states increased pain in glute over the weekend, as well as into shin. 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    Pt states Increased pain in R LE, at least 1 month. Initially felt sensation at R lower/lateral ankle. R knee also sore at times. She has Bil hip replacements and notes some pain in R lateral hip at times. Is having more frequent pain in R glute. Feels pretty good in the morning. Pain increasing as she is walking longer distances.   PERTINENT HISTORY:   OA, bil hip replacements,    PAIN:  Are you having pain? Yes: NPRS scale: 4-6/10 Pain location: R hip, glute, into LE Pain description: sore,  Aggravating factors: increased walking  Relieving factors: none stated     PRECAUTIONS: None   WEIGHT BEARING RESTRICTIONS: No   FALLS:  Has patient fallen in last 6 months? No   PLOF: Independent  PATIENT GOALS:  Decreased pain in hip/back, leg  NEXT MD VISIT:   OBJECTIVE:   DIAGNOSTIC FINDINGS:   PATIENT SURVEYS:   Mod Oswestry:  12/50  = 24 % disability    COGNITION: Overall cognitive status: Within functional limits for tasks assessed     SENSATION: WFL  EDEMA:    POSTURE:   Standing : R hip higher,  Supine: likely R LE longer, L shorter    PALPATION: Tenderness in R glute, along sacral border , into piriformis, mild soreness in Lateral hip R>L .  LOWER EXTREMITY ROM:  MMT/strength Left eval Right eval  Hip flexion 4+ 4- (pain with SLR/strength test)   Hip extension    Hip abduction  4-   Hip adduction    Hip internal rotation    Hip external rotation  4-  Knee flexion  5  Knee extension  5  Ankle dorsiflexion    Ankle plantarflexion    Ankle  inversion    Ankle eversion     (Blank rows = not tested)  LOWER EXTREMITY ROM Lumbar: mild limitation for flex, mod for ext. Mod for R SB Hip ROM: minimal limitations.  Knees: WFL.   LOWER EXTREMITY SPECIAL TESTS:    FUNCTIONAL TESTS:  Placed lift under L foot in standing- leg length appears even with lift.    GAIT: Distance walked: 100 ft  Assistive device utilized: None Level of assistance: Complete Independence Comments: uneven appearing leg length   TODAY'S TREATMENT:                                                                                                                              DATE:   03/16/2024 Therapeutic Exercise: Aerobic:    Supine:   SLR 2 x 10 bil;  S/L:  clams 2 x 10 on R;  Seated: Standing:  Lumbar side  glides L  and R- to tolerance, without pain  x 12 ea  Stretches:   SKTC x 3 bil;  LTR x 10- education for HEP , lumbar flexion seated and standing - x 3 ea;   seated hSS 30 sec x 3 ;  Neuromuscular Re-education:  Manual Therapy:   LAD on R for lumbar distraction ; Therapeutic Activity:  Self Care:    Therapeutic Exercise: Aerobic:   bike L2 x 6 min  Supine:   bridging with GTB at thighs x 15 ;  SLR 2 x 10 bil;  clams GTB x 20- alternating with TA  S/L:  clams 2 x 10 on R;  Seated: Standing:  hip abd 2 x 10 bil; ( no pain)  Stretches:   piriformis 30 sec x 3 on R ;  Hip IR across body stretch x 3 on R;  Neuromuscular Re-education:  Manual Therapy:   LAD on R for lumbar distraction ; STM/DTM to R glute Therapeutic Activity: Step ups 6 in, light 1 UE support x 12 bil;  Slow march x 20;  Self Care:    Eval: ther ex: see below for HEP SLR and hip clams- improved comfort with repeated repetitions and better muscle recruitment. SLR initially painful in sacrum and glute.   PATIENT EDUCATION:  Education details:  updated and reviewed HEP Person educated: Patient Education method: Explanation, Demonstration, Tactile cues, Verbal cues, and Handouts Education comprehension: verbalized understanding, returned demonstration, verbal cues required, tactile cues required, and needs further education   HOME EXERCISE PROGRAM: Access Code: [email or web address]    ASSESSMENT:  CLINICAL IMPRESSION: Pt with noted increase in pain in glute and LE with trial for lumbar extension and R side bending today. Relief with flexion and L side bending. Reviewed this for HEP and for limiting these motions with daily activities. Reviewed more lumbar flexion motion to do first thing in AM as well as with HEP. Pt to benefit from continued care.   Eval: Patient presents with primary complaint of  primary complaint of pain  in R glute and hip. She has most tenderness in R glute and along sacral border. She does have quite  a bit of weakness in R hip vs L, with increased/reproduced pain with movement.  She also has appearing leg length deficit, may consider lift to L side in future. Pt will benefit from education on HEP for back and hip pain. Pt with decreased ability for full functional activities. Pt will  benefit from skilled PT to improve deficits and pain and to return to PLOF.   OBJECTIVE IMPAIRMENTS: Abnormal gait, decreased activity tolerance, decreased mobility, decreased strength, increased muscle spasms, improper body mechanics, postural dysfunction, and pain.   ACTIVITY LIMITATIONS: lifting, bending, standing, stairs, reach over head, and locomotion level  PARTICIPATION LIMITATIONS: cleaning, laundry, shopping, and community activity  PERSONAL FACTORS: Past/current experiences, Time since onset of injury/illness/exacerbation, and 1 comorbidity: hip replacement, possible leg length deficit  are also affecting patient's functional outcome.   REHAB POTENTIAL: Good  CLINICAL DECISION MAKING: Stable/uncomplicated  EVALUATION COMPLEXITY: Low   GOALS: Goals reviewed with patient? Yes  SHORT TERM GOALS: Target date: 03/23/2024   Pt to be independent with HEP  Goal status: INITIAL  2.  Pt to demo ability for active TA contraction for stabilization of core   Goal status: INITIAL   LONG TERM GOALS: Target date: 04/27/2024   Pt to be independent with final HEP  Goal status: INITIAL  2.  Pt to demo improved strength of R hip, to at least 4/5, and ability for SLR without pain in hip or back.  Goal status: INITIAL  3.  Pt to demo ability for walking up to 2 miles without pain in hip/back/LE.   Goal status: INITIAL  4.  Pt to report decreased pain in R glute/LE to 0-2/10 with activity.  Goal status: INITIAL    PLAN:  PT FREQUENCY: 1-2x/week  PT DURATION: 8 weeks  PLANNED INTERVENTIONS: Therapeutic exercises, Therapeutic activity, Neuromuscular re-education, Patient/Family education,  Self Care, Joint mobilization, Joint manipulation, Stair training, Orthotic/Fit training, DME instructions, Aquatic Therapy, Dry Needling, Electrical stimulation, Cryotherapy, Moist heat, Taping, Ultrasound, Ionotophoresis 4mg /ml Dexamethasone, Manual therapy,  Vasopneumatic device, Traction, Spinal manipulation, Spinal mobilization,Balance training, Gait training,   PLAN FOR NEXT SESSION:    Tinnie Don, PT, DPT 11:36 AM  03/16/24

## 2024-03-17 ENCOUNTER — Other Ambulatory Visit: Payer: Self-pay | Admitting: Family Medicine

## 2024-03-18 ENCOUNTER — Ambulatory Visit: Admitting: Physical Therapy

## 2024-03-18 ENCOUNTER — Encounter: Payer: Self-pay | Admitting: Physical Therapy

## 2024-03-18 DIAGNOSIS — M5416 Radiculopathy, lumbar region: Secondary | ICD-10-CM

## 2024-03-18 NOTE — Therapy (Signed)
 OUTPATIENT PHYSICAL THERAPY LOWER EXTREMITY TREATMENT   Patient Name: Heather Adams MRN: [id number] DOB:Nov 02, 1950, 73 y.o., female Today's Date: 03/18/2024  END OF SESSION:  PT End of Session - 03/18/24 0854     Visit Number 5    Number of Visits 16    Date for Recertification  04/27/24    Authorization Type Medicare    PT Start Time 0853    PT Stop Time 0931    PT Time Calculation (min) 38 min    Activity Tolerance Patient tolerated treatment well    Behavior During Therapy [facility] for tasks assessed/performed           Past Medical History:  Diagnosis Date   Fainting spell    with blood draws    Hyperlipidemia    Osteoarthritis of hip    right   Past Surgical History:  Procedure Laterality Date   COLONOSCOPY  01-17-12   per Dr. Alyce Lesches in [place], hemorrhoids, no polyps, repeat in 5 yrs    endometrosis  1986   Hemmorriod sugery  04-04-2015   hisogram  1986   TONSILLECTOMY  1977   TOTAL HIP ARTHROPLASTY  7989,7988   Patient Active Problem List   Diagnosis Date Noted   Essential hypertension 05/20/2019   ESOPHAGEAL REFLUX 02/02/2010   Dyslipidemia 09/22/2008   OSTEOARTHRITIS, HIPS, BILATERAL 09/22/2008     PCP: Wolm Scarlet  REFERRING PROVIDER: Artist Lloyd   REFERRING DIAG: Lumbar radiculopathy  THERAPY DIAG:  Radiculopathy, lumbar region  Rationale for Evaluation and Treatment: Rehabilitation  ONSET DATE:    SUBJECTIVE:   SUBJECTIVE STATEMENT: Pt still feeling discomfort/tingling in R lower leg/shin, but notes less often and less intense overall. Still feeling it daily, and especially at end of the day.   Pt states Increased pain in R LE, at least 1 month. Initially felt sensation at R lower/lateral ankle. R knee also sore at times. She has Bil hip replacements and notes some pain in R lateral hip at times. Is having more frequent pain in R glute. Feels pretty good in the morning. Pain increasing as she is walking longer distances.    PERTINENT HISTORY:  OA, bil hip replacements,    PAIN:  Are you having pain? Yes: NPRS scale: 4-6/10 Pain location: R hip, glute, into LE Pain description: sore,  Aggravating factors: increased walking  Relieving factors: none stated     PRECAUTIONS: None   WEIGHT BEARING RESTRICTIONS: No   FALLS:  Has patient fallen in last 6 months? No   PLOF: Independent  PATIENT GOALS:  Decreased pain in hip/back, leg  NEXT MD VISIT:   OBJECTIVE:   DIAGNOSTIC FINDINGS:   PATIENT SURVEYS:   Mod Oswestry:  12/50  = 24 % disability    COGNITION: Overall cognitive status: Within functional limits for tasks assessed     SENSATION: WFL  EDEMA:    POSTURE:   Standing : R hip higher,  Supine: likely R LE longer, L shorter    PALPATION: Tenderness in R glute, along sacral border , into piriformis, mild soreness in Lateral hip R>L .  LOWER EXTREMITY ROM:  MMT/strength Left eval Right eval  Hip flexion 4+ 4- (pain with SLR/strength test)   Hip extension    Hip abduction  4-   Hip adduction    Hip internal rotation    Hip external rotation  4-  Knee flexion  5  Knee extension  5  Ankle dorsiflexion    Ankle plantarflexion  Ankle inversion    Ankle eversion     (Blank rows = not tested)  LOWER EXTREMITY ROM Lumbar: mild limitation for flex, mod for ext. Mod for R SB Hip ROM: minimal limitations.  Knees: WFL.   LOWER EXTREMITY SPECIAL TESTS:    FUNCTIONAL TESTS:  Placed lift under L foot in standing- leg length appears even with lift.    GAIT: Distance walked: 100 ft  Assistive device utilized: None Level of assistance: Complete Independence Comments: uneven appearing leg length   TODAY'S TREATMENT:                                                                                                                              DATE:   03/18/2024 Therapeutic Exercise: Aerobic:    Supine:    Quadruped: cat/cow x 15 (pain in R glute with cow)   S/L:  clams 2 x 10 on R;  Seated: Standing:  Lumbar side glides L and R- to tolerance, without pain  x 12 ea ;  Step ups 6 in x 10 bil (pain with loading on R side).  Stretches:   SKTC x 3 bil;  LTR x 10- education for HEP, lumbar flexion seated and standing - x 3 ea;   supine hSS with ankle pump 30 sec x 3 ;  Neuromuscular Re-education:  Manual Therapy:   LAD bil  for lumbar distraction ;STM/DTM R low lumbar paraspinals, Lumbar PA mobs  Therapeutic Activity: Self Care:     Therapeutic Exercise: Aerobic:    Supine:   SLR 2 x 10 bil;  S/L:  clams 2 x 10 on R;  Seated: Standing:  Lumbar side glides L  and R- to tolerance, without pain  x 12 ea  Stretches:   SKTC x 3 bil;  LTR x 10- education for HEP , lumbar flexion seated and standing - x 3 ea;   seated hSS 30 sec x 3 ;  Neuromuscular Re-education:  Manual Therapy:   LAD on R for lumbar distraction ; Therapeutic Activity:  Self Care:    Therapeutic Exercise: Aerobic:   bike L2 x 6 min  Supine:   bridging with GTB at thighs x 15 ;  SLR 2 x 10 bil;  clams GTB x 20- alternating with TA  S/L:  clams 2 x 10 on R;  Seated: Standing:  hip abd 2 x 10 bil; ( no pain)  Stretches:   piriformis 30 sec x 3 on R ;  Hip IR across body stretch x 3 on R;  Neuromuscular Re-education:  Manual Therapy:   LAD on R for lumbar distraction ; STM/DTM to R glute Therapeutic Activity: Step ups 6 in, light 1 UE support x 12 bil;  Slow march x 20;  Self Care:    Eval: ther ex: see below for HEP SLR and hip clams- improved comfort with repeated repetitions and better muscle recruitment. SLR initially painful in sacrum and glute.  PATIENT EDUCATION:  Education details:  updated and reviewed HEP Person educated: Patient Education method: Explanation, Demonstration, Tactile cues, Verbal cues, and Handouts Education comprehension: verbalized understanding, returned demonstration, verbal cues required, tactile cues required, and needs further  education   HOME EXERCISE PROGRAM: Access Code: [email or web address]    ASSESSMENT:  CLINICAL IMPRESSION: Pt with continued pain with extension and loading of R side. She reports lessening symptoms in LE, but still present. Pt will benefit from continued decompression and pain relief as tolerated.   Eval: Patient presents with primary complaint of  primary complaint of pain in R glute and hip. She has most tenderness in R glute and along sacral border. She does have quite a bit of weakness in R hip vs L, with increased/reproduced pain with movement.  She also has appearing leg length deficit, may consider lift to L side in future. Pt will benefit from education on HEP for back and hip pain. Pt with decreased ability for full functional activities. Pt will  benefit from skilled PT to improve deficits and pain and to return to PLOF.   OBJECTIVE IMPAIRMENTS: Abnormal gait, decreased activity tolerance, decreased mobility, decreased strength, increased muscle spasms, improper body mechanics, postural dysfunction, and pain.   ACTIVITY LIMITATIONS: lifting, bending, standing, stairs, reach over head, and locomotion level  PARTICIPATION LIMITATIONS: cleaning, laundry, shopping, and community activity  PERSONAL FACTORS: Past/current experiences, Time since onset of injury/illness/exacerbation, and 1 comorbidity: hip replacement, possible leg length deficit  are also affecting patient's functional outcome.   REHAB POTENTIAL: Good  CLINICAL DECISION MAKING: Stable/uncomplicated  EVALUATION COMPLEXITY: Low   GOALS: Goals reviewed with patient? Yes  SHORT TERM GOALS: Target date: 03/23/2024   Pt to be independent with HEP  Goal status: INITIAL  2.  Pt to demo ability for active TA contraction for stabilization of core   Goal status: INITIAL   LONG TERM GOALS: Target date: 04/27/2024   Pt to be independent with final HEP  Goal status: INITIAL  2.  Pt to demo improved strength of R hip, to  at least 4/5, and ability for SLR without pain in hip or back.  Goal status: INITIAL  3.  Pt to demo ability for walking up to 2 miles without pain in hip/back/LE.   Goal status: INITIAL  4.  Pt to report decreased pain in R glute/LE to 0-2/10 with activity.  Goal status: INITIAL    PLAN:  PT FREQUENCY: 1-2x/week  PT DURATION: 8 weeks  PLANNED INTERVENTIONS: Therapeutic exercises, Therapeutic activity, Neuromuscular re-education, Patient/Family education, Self Care, Joint mobilization, Joint manipulation, Stair training, Orthotic/Fit training, DME instructions, Aquatic Therapy, Dry Needling, Electrical stimulation, Cryotherapy, Moist heat, Taping, Ultrasound, Ionotophoresis 4mg /ml Dexamethasone, Manual therapy,  Vasopneumatic device, Traction, Spinal manipulation, Spinal mobilization,Balance training, Gait training,   PLAN FOR NEXT SESSION:    Tinnie Don, PT, DPT 9:02 AM  03/18/24

## 2024-03-20 ENCOUNTER — Ambulatory Visit: Admitting: Family Medicine

## 2024-03-24 ENCOUNTER — Ambulatory Visit (INDEPENDENT_AMBULATORY_CARE_PROVIDER_SITE_OTHER): Admitting: Physical Therapy

## 2024-03-24 ENCOUNTER — Encounter: Payer: Self-pay | Admitting: Physical Therapy

## 2024-03-24 DIAGNOSIS — M5416 Radiculopathy, lumbar region: Secondary | ICD-10-CM

## 2024-03-24 NOTE — Therapy (Signed)
 OUTPATIENT PHYSICAL THERAPY LOWER EXTREMITY TREATMENT   Patient Name: Heather Adams MRN: [id number] DOB:[birth year], 73 y.o., female Today's Date: 03/24/2024  END OF SESSION:  PT End of Session - 03/24/24 0838     Visit Number 6    Number of Visits 16    Date for Recertification  04/27/24    Authorization Type Medicare    PT Start Time 0840    PT Stop Time 0923    PT Time Calculation (min) 43 min    Activity Tolerance Patient tolerated treatment well    Behavior During Therapy [facility] for tasks assessed/performed           Past Medical History:  Diagnosis Date   Fainting spell    with blood draws    Hyperlipidemia    Osteoarthritis of hip    right   Past Surgical History:  Procedure Laterality Date   COLONOSCOPY  01-17-12   per Dr. Alyce Lesches in [place], hemorrhoids, no polyps, repeat in 5 yrs    endometrosis  1986   Hemmorriod sugery  04-04-2015   hisogram  1986   TONSILLECTOMY  1977   TOTAL HIP ARTHROPLASTY  7989,7988   Patient Active Problem List   Diagnosis Date Noted   Essential hypertension 05/20/2019   ESOPHAGEAL REFLUX 02/02/2010   Dyslipidemia 09/22/2008   OSTEOARTHRITIS, HIPS, BILATERAL 09/22/2008    PCP: Wolm Scarlet  REFERRING PROVIDER: Artist Lloyd   REFERRING DIAG: Lumbar radiculopathy  THERAPY DIAG:  Radiculopathy, lumbar region  Rationale for Evaluation and Treatment: Rehabilitation  ONSET DATE:    SUBJECTIVE:   SUBJECTIVE STATEMENT: Pt still feeling discomfort/tingling in R lower leg/shin, as well as soreness in R glute. She rode in car for a few hours yesterday, but did stop and walk and walk when she got home. Has f/u with MD this week.   Pt states Increased pain in R LE, at least 1 month. Initially felt sensation at R lower/lateral ankle. R knee also sore at times. She has Bil hip replacements and notes some pain in R lateral hip at times. Is having more frequent pain in R glute. Feels pretty good in the morning. Pain  increasing as she is walking longer distances.   PERTINENT HISTORY:  OA, bil hip replacements,    PAIN:  Are you having pain? Yes: NPRS scale: 4-6/10 Pain location: R hip, glute, into LE Pain description: sore,  Aggravating factors: increased walking  Relieving factors: none stated     PRECAUTIONS: None   WEIGHT BEARING RESTRICTIONS: No   FALLS:  Has patient fallen in last 6 months? No   PLOF: Independent  PATIENT GOALS:  Decreased pain in hip/back, leg  NEXT MD VISIT:   OBJECTIVE:   DIAGNOSTIC FINDINGS:   PATIENT SURVEYS:   Mod Oswestry:  12/50  = 24 % disability    COGNITION: Overall cognitive status: Within functional limits for tasks assessed     SENSATION: WFL  EDEMA:    POSTURE:   Standing : R hip higher,  Supine: likely R LE longer, L shorter    PALPATION: Tenderness in R glute, along sacral border , into piriformis, mild soreness in Lateral hip R>L .  LOWER EXTREMITY ROM:  MMT/strength Left eval Right eval  Hip flexion 4+ 4- (pain with SLR/strength test)   Hip extension    Hip abduction  4-   Hip adduction    Hip internal rotation    Hip external rotation  4-  Knee flexion  5  Knee  extension  5  Ankle dorsiflexion    Ankle plantarflexion    Ankle inversion    Ankle eversion     (Blank rows = not tested)  LOWER EXTREMITY ROM Lumbar: mild limitation for flex, mod for ext. Mod for R SB Hip ROM: minimal limitations.  Knees: WFL.   LOWER EXTREMITY SPECIAL TESTS:    FUNCTIONAL TESTS:  Placed lift under L foot in standing- leg length appears even with lift.    GAIT: Distance walked: 100 ft  Assistive device utilized: None Level of assistance: Complete Independence Comments: uneven appearing leg length   TODAY'S TREATMENT:                                                                                                                              DATE:   03/24/2024 Therapeutic Exercise: prom hip IR/ER (pain with IR)  in  supine and prone Aerobic:    Supine:    Quadruped: cat/cow x 15 (pain in R glute with cow)  S/L:  clams 2 x 10 on R;  hip abd x 15 on R  Seated: Standing:  Lumbar side glides  R- to tolerance, without pain  x 12 ea ;  Stretches:   SKTC x 3 bil;  LTR x 10- education for HEP, lumbar flexion seated and standing - x 3 ea;    Neuromuscular Re-education:  Manual Therapy:   LAD bil  for lumbar distraction ; manual stretch for Hip IR/ER  Therapeutic Activity: Self Care: Trigger Point Dry Needling  Initial Treatment: Pt instructed on Dry Needling rational, procedures, and possible side effects. Pt instructed to expect mild to moderate muscle soreness later in the day and/or into the next day.  Pt instructed in methods to reduce muscle soreness. Pt instructed to continue prescribed HEP. Patient was educated on signs and symptoms of infection and other risk factors and advised to seek medical attention should they occur.  Patient verbalized understanding of these instructions and education.   Patient Verbal Consent Given: Yes Education Handout Provided: Yes Muscles Treated: R piriformis, glute med Electrical Stimulation Performed: No Treatment Response/Outcome: palpable increase in muscle length, twitch response for piriformis      Therapeutic Exercise: Aerobic:    Supine:   SLR 2 x 10 bil;  S/L:  clams 2 x 10 on R;  Seated: Standing:  Lumbar side glides L  and R- to tolerance, without pain  x 12 ea  Stretches:   SKTC x 3 bil;  LTR x 10- education for HEP , lumbar flexion seated and standing - x 3 ea;   seated hSS 30 sec x 3 ;  Neuromuscular Re-education:  Manual Therapy:   LAD on R for lumbar distraction ; Therapeutic Activity:  Self Care:    Therapeutic Exercise: Aerobic:   bike L2 x 6 min  Supine:   bridging with GTB at thighs x 15 ;  SLR 2 x 10 bil;  clams GTB  x 20- alternating with TA  S/L:  clams 2 x 10 on R;  Seated: Standing:  hip abd 2 x 10 bil; ( no pain)  Stretches:    piriformis 30 sec x 3 on R ;  Hip IR across body stretch x 3 on R;  Neuromuscular Re-education:  Manual Therapy:   LAD on R for lumbar distraction ; STM/DTM to R glute Therapeutic Activity: Step ups 6 in, light 1 UE support x 12 bil;  Slow march x 20;  Self Care:    Eval: ther ex: see below for HEP SLR and hip clams- improved comfort with repeated repetitions and better muscle recruitment. SLR initially painful in sacrum and glute.   PATIENT EDUCATION:  Education details:  updated and reviewed HEP Person educated: Patient Education method: Explanation, Demonstration, Tactile cues, Verbal cues, and Handouts Education comprehension: verbalized understanding, returned demonstration, verbal cues required, tactile cues required, and needs further education   HOME EXERCISE PROGRAM: Access Code: [email or web address]    ASSESSMENT:  CLINICAL IMPRESSION: Pt with continued pain with extension and loading of R side, as well as hip IR in supine and prone today. She does have trigger points in R glute, addressed with dry needling today as trial for muscle tension and pain relief. She has symptoms stemming from hip and lumbar. Symptoms not significantly resolved thus far, and she has MD appt this week.   Eval: Patient presents with primary complaint of  primary complaint of pain in R glute and hip. She has most tenderness in R glute and along sacral border. She does have quite a bit of weakness in R hip vs L, with increased/reproduced pain with movement.  She also has appearing leg length deficit, may consider lift to L side in future. Pt will benefit from education on HEP for back and hip pain. Pt with decreased ability for full functional activities. Pt will  benefit from skilled PT to improve deficits and pain and to return to PLOF.   OBJECTIVE IMPAIRMENTS: Abnormal gait, decreased activity tolerance, decreased mobility, decreased strength, increased muscle spasms, improper body mechanics, postural  dysfunction, and pain.   ACTIVITY LIMITATIONS: lifting, bending, standing, stairs, reach over head, and locomotion level  PARTICIPATION LIMITATIONS: cleaning, laundry, shopping, and community activity  PERSONAL FACTORS: Past/current experiences, Time since onset of injury/illness/exacerbation, and 1 comorbidity: hip replacement, possible leg length deficit  are also affecting patient's functional outcome.   REHAB POTENTIAL: Good  CLINICAL DECISION MAKING: Stable/uncomplicated  EVALUATION COMPLEXITY: Low   GOALS: Goals reviewed with patient? Yes  SHORT TERM GOALS: Target date: 03/23/2024   Pt to be independent with HEP  Goal status: MET  2.  Pt to demo ability for active TA contraction for stabilization of core   Goal status: MET   LONG TERM GOALS: Target date: 04/27/2024   Pt to be independent with final HEP  Goal status: INITIAL  2.  Pt to demo improved strength of R hip, to at least 4/5, and ability for SLR without pain in hip or back.  Goal status: INITIAL  3.  Pt to demo ability for walking up to 2 miles without pain in hip/back/LE.   Goal status: INITIAL  4.  Pt to report decreased pain in R glute/LE to 0-2/10 with activity.  Goal status: INITIAL    PLAN:  PT FREQUENCY: 1-2x/week  PT DURATION: 8 weeks  PLANNED INTERVENTIONS: Therapeutic exercises, Therapeutic activity, Neuromuscular re-education, Patient/Family education, Self Care, Joint mobilization, Joint manipulation, Stair training, Orthotic/Fit training, DME  instructions, Aquatic Therapy, Dry Needling, Electrical stimulation, Cryotherapy, Moist heat, Taping, Ultrasound, Ionotophoresis 4mg /ml Dexamethasone, Manual therapy,  Vasopneumatic device, Traction, Spinal manipulation, Spinal mobilization,Balance training, Gait training,   PLAN FOR NEXT SESSION:    Tinnie Don, PT, DPT 8:39 AM  03/24/24

## 2024-03-24 NOTE — Patient Instructions (Signed)

## 2024-03-25 ENCOUNTER — Encounter: Payer: Self-pay | Admitting: Family Medicine

## 2024-03-25 ENCOUNTER — Ambulatory Visit: Payer: Self-pay | Admitting: Family Medicine

## 2024-03-25 ENCOUNTER — Ambulatory Visit (INDEPENDENT_AMBULATORY_CARE_PROVIDER_SITE_OTHER): Admitting: Family Medicine

## 2024-03-25 VITALS — BP 112/60 | HR 85 | Temp 98.3°F | Ht 61.42 in | Wt 145.8 lb

## 2024-03-25 DIAGNOSIS — E785 Hyperlipidemia, unspecified: Secondary | ICD-10-CM

## 2024-03-25 DIAGNOSIS — I10 Essential (primary) hypertension: Secondary | ICD-10-CM

## 2024-03-25 LAB — LIPID PANEL
Cholesterol: 202 mg/dL — ABNORMAL HIGH (ref 0–200)
HDL: 54.4 mg/dL (ref >39.00)
LDL Cholesterol: 116 mg/dL — ABNORMAL HIGH (ref 0–99)
NonHDL: 147.89
Total CHOL/HDL Ratio: 4
Triglycerides: 158 mg/dL — ABNORMAL HIGH (ref 0.0–149.0)
VLDL: 31.6 mg/dL (ref 0.0–40.0)

## 2024-03-25 LAB — COMPREHENSIVE METABOLIC PANEL WITH GFR
ALT: 19 U/L (ref 0–35)
AST: 19 U/L (ref 0–37)
Albumin: 4.9 g/dL (ref 3.5–5.2)
Alkaline Phosphatase: 63 U/L (ref 39–117)
BUN: 16 mg/dL (ref 6–23)
CO2: 27 meq/L (ref 19–32)
Calcium: 10 mg/dL (ref 8.4–10.5)
Chloride: 103 meq/L (ref 96–112)
Creatinine, Ser: 1.03 mg/dL (ref 0.40–1.20)
GFR: 54.64 mL/min — ABNORMAL LOW (ref >60.00)
Glucose, Bld: 94 mg/dL (ref 70–99)
Potassium: 4.2 meq/L (ref 3.5–5.1)
Sodium: 141 meq/L (ref 135–145)
Total Bilirubin: 1.2 mg/dL (ref 0.2–1.2)
Total Protein: 7.6 g/dL (ref 6.0–8.3)

## 2024-03-25 MED ORDER — AMLODIPINE BESYLATE 5 MG PO TABS: 5.0000 mg | ORAL_TABLET | Freq: Every day | ORAL | 3 refills | Status: AC

## 2024-03-25 MED ORDER — LOSARTAN POTASSIUM 50 MG PO TABS: 50.0000 mg | ORAL_TABLET | Freq: Every day | ORAL | 3 refills | Status: AC

## 2024-03-25 MED ORDER — ATORVASTATIN CALCIUM 20 MG PO TABS: ORAL_TABLET | ORAL | 3 refills | Status: AC

## 2024-03-25 NOTE — Progress Notes (Signed)
 Established Patient Office Visit  Subjective   Patient ID: Heather Adams, female    DOB: 01-21-1951  Age: 73 y.o. MRN: [id number]  Chief Complaint  Patient presents with   Annual Exam    HPI   Heather Adams is seen for annual medical follow-up.  Generally doing well.  She has had some ongoing right leg pain and is followed by sports medicine.  She had some recent dry needling and is seeing physical therapy.  Still has some pain with ambulation.  She feels like this is likely muscular etiology.  She had acute cough back in July with some wheezing.  Wheezing resolved after just a day of prednisone .  Chronic medical problems include hypertension, dyslipidemia, osteoarthritis.  Her chronic medications include amlodipine  5 mg daily, Lipitor 20 mg daily, losartan  50 mg daily.  No recent dizziness.  No chest pains.  Compliant with medications.  Denies any medication side effects.  Due for follow-up labs.  She and her husband plan to get flu vaccine next week.  She did have questions regarding RSV vaccine.  Past Medical History:  Diagnosis Date   Fainting spell    with blood draws    Hyperlipidemia    Osteoarthritis of hip    right   Past Surgical History:  Procedure Laterality Date   COLONOSCOPY  01-17-12   per Dr. Alyce Adams in [place], hemorrhoids, no polyps, repeat in 5 yrs    endometrosis  1986   Hemmorriod sugery  04-04-2015   hisogram  1986   TONSILLECTOMY  1977   TOTAL HIP ARTHROPLASTY  7989,7988    reports that she has never smoked. She has never used smokeless tobacco. She reports current alcohol use. She reports that she does not use drugs. family history includes Hearing loss in her paternal aunt, paternal grandfather, and paternal uncle; Heart disease (age of onset: 89) in her paternal grandfather; Heart disease (age of onset: 59) in her mother; Hyperlipidemia in her mother; Hypertension in her mother; Mental retardation in her sister. Allergies  Allergen Reactions    Thimerosal (Thiomersal)     Review of Systems  Constitutional:  Negative for malaise/fatigue.  Eyes:  Negative for blurred vision.  Respiratory:  Negative for shortness of breath.   Cardiovascular:  Negative for chest pain.  Neurological:  Negative for dizziness, weakness and headaches.      Objective:     BP 112/60   Pulse 85   Temp 98.3 F (36.8 C) (Oral)   Ht 5' 1.42 (1.56 m)   Wt 145 lb 12.8 oz (66.1 kg)   SpO2 95%   BMI 27.18 kg/m  BP Readings from Last 3 Encounters:  03/25/24 112/60  02/13/24 112/72  01/28/24 120/60   Wt Readings from Last 3 Encounters:  03/25/24 145 lb 12.8 oz (66.1 kg)  02/25/24 147 lb (66.7 kg)  02/13/24 147 lb (66.7 kg)      Physical Exam Vitals reviewed.  Constitutional:      General: She is not in acute distress.    Appearance: She is well-developed. She is not ill-appearing.  Eyes:     Pupils: Pupils are equal, round, and reactive to light.  Neck:     Thyroid : No thyromegaly.     Vascular: No JVD.  Cardiovascular:     Rate and Rhythm: Normal rate and regular rhythm.     Heart sounds:     No gallop.  Pulmonary:     Effort: Pulmonary effort is normal. No respiratory distress.  Breath sounds: Normal breath sounds. No wheezing or rales.  Musculoskeletal:     Cervical back: Neck supple.     Right lower leg: No edema.     Left lower leg: No edema.  Neurological:     Mental Status: She is alert.      No results found for any visits on 03/25/24.    The 10-year ASCVD risk score (Arnett DK, et al., 2019) is: 10.9%    Assessment & Plan:   Problem List Items Addressed This Visit       Unprioritized   Essential hypertension - Primary   Relevant Medications   atorvastatin  (LIPITOR) 20 MG tablet   amLODipine  (NORVASC ) 5 MG tablet   losartan  (COZAAR ) 50 MG tablet   Other Visit Diagnoses       Hyperlipidemia, unspecified hyperlipidemia type       Relevant Medications   atorvastatin  (LIPITOR) 20 MG tablet    amLODipine  (NORVASC ) 5 MG tablet   losartan  (COZAAR ) 50 MG tablet   Other Relevant Orders   Lipid panel   CMP     Here for chronic medical follow-up.  She has hypertension which is well-controlled with amlodipine  and losartan .  Refills provided for 1 year.  Check comprehensive metabolic panel.  Hyperlipidemia treated with atorvastatin  20 mg daily.  No known family history of premature CAD.  Recheck fasting lipid and CMP today.  Continue low saturated fat diet.  No follow-ups on file.    Heather Scarlet, MD

## 2024-03-26 ENCOUNTER — Ambulatory Visit: Admitting: Physical Therapy

## 2024-03-26 ENCOUNTER — Ambulatory Visit (INDEPENDENT_AMBULATORY_CARE_PROVIDER_SITE_OTHER): Admitting: Family Medicine

## 2024-03-26 ENCOUNTER — Encounter: Payer: Self-pay | Admitting: Physical Therapy

## 2024-03-26 VITALS — BP 114/74 | HR 67 | Ht 61.0 in | Wt 146.0 lb

## 2024-03-26 DIAGNOSIS — M25551 Pain in right hip: Secondary | ICD-10-CM

## 2024-03-26 DIAGNOSIS — M5416 Radiculopathy, lumbar region: Secondary | ICD-10-CM | POA: Diagnosis not present

## 2024-03-26 NOTE — Therapy (Signed)
 OUTPATIENT PHYSICAL THERAPY LOWER EXTREMITY TREATMENT   Patient Name: Heather Adams MRN: [id number] DOB:1951-03-22, 73 y.o., female Today's Date: 03/26/2024  END OF SESSION:  PT End of Session - 03/26/24 1525     Visit Number 7    Number of Visits 16    Date for Recertification  04/27/24    Authorization Type Medicare    PT Start Time 1435    PT Stop Time 1515    PT Time Calculation (min) 40 min    Activity Tolerance Patient tolerated treatment well    Behavior During Therapy [facility] for tasks assessed/performed            Past Medical History:  Diagnosis Date   Fainting spell    with blood draws    Hyperlipidemia    Osteoarthritis of hip    right   Past Surgical History:  Procedure Laterality Date   COLONOSCOPY  01-17-12   per Dr. Alyce Lesches in [place], hemorrhoids, no polyps, repeat in 5 yrs    endometrosis  1986   Hemmorriod sugery  04-04-2015   hisogram  1986   TONSILLECTOMY  1977   TOTAL HIP ARTHROPLASTY  7989,7988   Patient Active Problem List   Diagnosis Date Noted   Essential hypertension 05/20/2019   ESOPHAGEAL REFLUX 02/02/2010   Dyslipidemia 09/22/2008   OSTEOARTHRITIS, HIPS, BILATERAL 09/22/2008    PCP: Wolm Scarlet  REFERRING PROVIDER: Artist Lloyd   REFERRING DIAG: Lumbar radiculopathy  THERAPY DIAG:  Radiculopathy, lumbar region  Rationale for Evaluation and Treatment: Rehabilitation  ONSET DATE:    SUBJECTIVE:   SUBJECTIVE STATEMENT: Pt had f/u with MD today. She will be getting scheduled for MRI. States improving pain in glute after dry needling last session, but still feeling pain in R shin.    Pt states Increased pain in R LE, at least 1 month. Initially felt sensation at R lower/lateral ankle. R knee also sore at times. She has Bil hip replacements and notes some pain in R lateral hip at times. Is having more frequent pain in R glute. Feels pretty good in the morning. Pain increasing as she is walking longer distances.    PERTINENT HISTORY:  OA, bil hip replacements,    PAIN:  Are you having pain? Yes: NPRS scale: 4-6/10 Pain location: R hip, glute, into LE Pain description: sore,  Aggravating factors: increased walking  Relieving factors: none stated     PRECAUTIONS: None   WEIGHT BEARING RESTRICTIONS: No   FALLS:  Has patient fallen in last 6 months? No   PLOF: Independent  PATIENT GOALS:  Decreased pain in hip/back, leg  NEXT MD VISIT:   OBJECTIVE:   DIAGNOSTIC FINDINGS:   PATIENT SURVEYS:   Mod Oswestry:  12/50  = 24 % disability    COGNITION: Overall cognitive status: Within functional limits for tasks assessed     SENSATION: WFL  EDEMA:    POSTURE:   Standing : R hip higher,  Supine: likely R LE longer, L shorter    PALPATION: Tenderness in R glute, along sacral border , into piriformis, mild soreness in Lateral hip R>L .  LOWER EXTREMITY ROM:  MMT/strength Left eval Right eval  Hip flexion 4+ 4- (pain with SLR/strength test)   Hip extension    Hip abduction  4-   Hip adduction    Hip internal rotation    Hip external rotation  4-  Knee flexion  5  Knee extension  5  Ankle dorsiflexion  Ankle plantarflexion    Ankle inversion    Ankle eversion     (Blank rows = not tested)  LOWER EXTREMITY ROM Lumbar: mild limitation for flex, mod for ext. Mod for R SB Hip ROM: minimal limitations.  Knees: WFL.   LOWER EXTREMITY SPECIAL TESTS:    FUNCTIONAL TESTS:  Placed lift under L foot in standing- leg length appears even with lift.    GAIT: Distance walked: 100 ft  Assistive device utilized: None Level of assistance: Complete Independence Comments: uneven appearing leg length   TODAY'S TREATMENT:                                                                                                                              DATE:   03/26/2024 Therapeutic Exercise:   Aerobic:    Supine:   SLR x10 then x 5 (mild pain in R groin);   bridging with  GTB x 10 ; Review of TA x 10 for optimal mechanics.  S/L:  clams 2 x 10 on R;   Seated: Standing:   Stretches:   LTR x 10 ;   SKTC x 3 bil;  LAQ with ankle pump 2 x 10 on R for nerve glide;  Neuromuscular Re-education:  Manual Therapy:   LAD bil  for lumbar distraction ; manual stretch for Hip IR/ER  Therapeutic Activity: Self Care:      Therapeutic Exercise: Aerobic:    Supine:   SLR 2 x 10 bil;  S/L:  clams 2 x 10 on R;  Seated: Standing:  Lumbar side glides L  and R- to tolerance, without pain  x 12 ea  Stretches:   SKTC x 3 bil;  LTR x 10- education for HEP , lumbar flexion seated and standing - x 3 ea;   seated hSS 30 sec x 3 ;  Neuromuscular Re-education:  Manual Therapy:   LAD on R for lumbar distraction ; Therapeutic Activity:  Self Care:    Therapeutic Exercise: Aerobic:   bike L2 x 6 min  Supine:   bridging with GTB at thighs x 15 ;  SLR 2 x 10 bil;  clams GTB x 20- alternating with TA  S/L:  clams 2 x 10 on R;  Seated: Standing:  hip abd 2 x 10 bil; ( no pain)  Stretches:   piriformis 30 sec x 3 on R ;  Hip IR across body stretch x 3 on R;  Neuromuscular Re-education:  Manual Therapy:   LAD on R for lumbar distraction ; STM/DTM to R glute Therapeutic Activity: Step ups 6 in, light 1 UE support x 12 bil;  Slow march x 20;  Self Care:    Eval: ther ex: see below for HEP SLR and hip clams- improved comfort with repeated repetitions and better muscle recruitment. SLR initially painful in sacrum and glute.   PATIENT EDUCATION:  Education details:  updated and reviewed HEP Person educated: Patient Education method:  Explanation, Demonstration, Tactile cues, Verbal cues, and Handouts Education comprehension: verbalized understanding, returned demonstration, verbal cues required, tactile cues required, and needs further education   HOME EXERCISE PROGRAM: Access Code: [email or web address]    ASSESSMENT:  CLINICAL IMPRESSION: Pt with continued pain with extension  and loading of R side. Reviewed ther ex and HEP today. Education on optimal TA contraction, activities to continue and positions to avoid if painful. She will continue HEP while she goes through MRI process. Pt will f/u with MD as needed, hold PT a this time.   Eval: Patient presents with primary complaint of  primary complaint of pain in R glute and hip. She has most tenderness in R glute and along sacral border. She does have quite a bit of weakness in R hip vs L, with increased/reproduced pain with movement.  She also has appearing leg length deficit, may consider lift to L side in future. Pt will benefit from education on HEP for back and hip pain. Pt with decreased ability for full functional activities. Pt will  benefit from skilled PT to improve deficits and pain and to return to PLOF.   OBJECTIVE IMPAIRMENTS: Abnormal gait, decreased activity tolerance, decreased mobility, decreased strength, increased muscle spasms, improper body mechanics, postural dysfunction, and pain.   ACTIVITY LIMITATIONS: lifting, bending, standing, stairs, reach over head, and locomotion level  PARTICIPATION LIMITATIONS: cleaning, laundry, shopping, and community activity  PERSONAL FACTORS: Past/current experiences, Time since onset of injury/illness/exacerbation, and 1 comorbidity: hip replacement, possible leg length deficit  are also affecting patient's functional outcome.   REHAB POTENTIAL: Good  CLINICAL DECISION MAKING: Stable/uncomplicated  EVALUATION COMPLEXITY: Low   GOALS: Goals reviewed with patient? Yes  SHORT TERM GOALS: Target date: 03/23/2024   Pt to be independent with HEP  Goal status: MET  2.  Pt to demo ability for active TA contraction for stabilization of core   Goal status: MET   LONG TERM GOALS: Target date: 04/27/2024   Pt to be independent with final HEP  Goal status: MET  2.  Pt to demo improved strength of R hip, to at least 4/5, and ability for SLR without pain in  hip or back.  Goal status: In progress  3.  Pt to demo ability for walking up to 2 miles without pain in hip/back/LE.   Goal status: In progress  4.  Pt to report decreased pain in R glute/LE to 0-2/10 with activity.  Goal status: Partially met     PLAN:  PT FREQUENCY: 1-2x/week  PT DURATION: 8 weeks  PLANNED INTERVENTIONS: Therapeutic exercises, Therapeutic activity, Neuromuscular re-education, Patient/Family education, Self Care, Joint mobilization, Joint manipulation, Stair training, Orthotic/Fit training, DME instructions, Aquatic Therapy, Dry Needling, Electrical stimulation, Cryotherapy, Moist heat, Taping, Ultrasound, Ionotophoresis 4mg /ml Dexamethasone, Manual therapy,  Vasopneumatic device, Traction, Spinal manipulation, Spinal mobilization,Balance training, Gait training,   PLAN FOR NEXT SESSION:    Tinnie Don, PT, DPT 3:25 PM  03/26/24

## 2024-03-26 NOTE — Progress Notes (Signed)
   LILLETTE Ileana Collet, PhD, LAT, ATC acting as a scribe for Artist Lloyd, MD.  Heather Adams is a 73 y.o. female who presents to [facility] at [facility] today for R hip pain and R-sided lumbar radiculopathy. Pt was last seen by Dr. Lloyd on 02/13/24 and prescribed gabapentin , and referred to PT, completing 6 visits.   Today, pt reports the R lateral hip pain has resolved w/ PT. Radicular pain into her R lower leg remains w/ tingling and muscle cramping. Pain is the R SI joint region/R buttock is about the same.   She notes pain radiating down her right leg to the lateral calf and anterior foot and ankle.  This has not improved with physical therapy.  Dx imaging: 02/13/24 L-spine XR  Pertinent review of systems: No fevers or chills  Relevant historical information: Hypertension   Exam:  BP 114/74   Pulse 67   Ht 5' 1 (1.549 m)   Wt 146 lb (66.2 kg)   SpO2 98%   BMI 27.59 kg/m  General: Well Developed, well nourished, and in no acute distress.   MSK: L-spine normal-appearing nontender palpation midline decreased lumbar motion.  Lower extremity strength is intact.    Lab and Radiology Results     Assessment and Plan: 73 y.o. female with right leg pain multifactorial.  Some of the pain is lumbar radiculopathy and has not improved with PT.  Plan for MRI lumbar spine to further characterize source of pain.  Of obvious L5 right nerve is being pinched we will go ahead and and order an epidural steroid injection if not return to clinic to go over the results in full detail.  Additionally she has some right lateral hip pain due to tendinopathy that has improved a bit with PT.   PDMP not reviewed this encounter. Orders Placed This Encounter  Procedures   MR Lumbar Spine Wo Contrast    Standing Status:   Future    Expiration Date:   03/26/2025    What is the patient's sedation requirement?:   No Sedation    Does the patient have a pacemaker or implanted devices?:    No    Preferred imaging location?:   [facility]   No orders of the defined types were placed in this encounter.    Discussed warning signs or symptoms. Please see discharge instructions. Patient expresses understanding.   The above documentation has been reviewed and is accurate and complete Artist Lloyd, M.D.

## 2024-03-26 NOTE — Patient Instructions (Signed)
 Thank you for coming in today.   You should hear from MRI scheduling within 1 week. If you do not hear please let me know.    Check back after we get the MRI results back

## 2024-03-30 ENCOUNTER — Encounter: Admitting: Physical Therapy

## 2024-03-30 DIAGNOSIS — Z23 Encounter for immunization: Secondary | ICD-10-CM | POA: Diagnosis not present

## 2024-04-01 ENCOUNTER — Ambulatory Visit
Admission: RE | Admit: 2024-04-01 | Discharge: 2024-04-01 | Disposition: A | Discharge status: Home / Self Care | Source: Ambulatory Visit | Attending: Family Medicine | Admitting: Family Medicine

## 2024-04-01 ENCOUNTER — Encounter: Admitting: Physical Therapy

## 2024-04-01 DIAGNOSIS — M5416 Radiculopathy, lumbar region: Secondary | ICD-10-CM

## 2024-04-02 DIAGNOSIS — M5117 Intervertebral disc disorders with radiculopathy, lumbosacral region: Secondary | ICD-10-CM | POA: Diagnosis not present

## 2024-04-02 DIAGNOSIS — M4727 Other spondylosis with radiculopathy, lumbosacral region: Secondary | ICD-10-CM | POA: Diagnosis not present

## 2024-04-02 DIAGNOSIS — M48061 Spinal stenosis, lumbar region without neurogenic claudication: Secondary | ICD-10-CM | POA: Diagnosis not present

## 2024-04-06 ENCOUNTER — Ambulatory Visit: Payer: Self-pay | Admitting: Family Medicine

## 2024-04-06 DIAGNOSIS — M5416 Radiculopathy, lumbar region: Secondary | ICD-10-CM

## 2024-04-06 NOTE — Progress Notes (Signed)
 MRI does show a pinched nerve involving the right leg.  I have ordered an epidural steroid injection.  You should hear from soon from [facility] imaging about scheduling the injection.

## 2024-04-07 ENCOUNTER — Inpatient Hospital Stay
Admission: RE | Admit: 2024-04-07 | Discharge: 2024-04-07 | Disposition: A | Source: Ambulatory Visit | Attending: Family Medicine | Admitting: Family Medicine

## 2024-04-07 DIAGNOSIS — M4726 Other spondylosis with radiculopathy, lumbar region: Secondary | ICD-10-CM | POA: Diagnosis not present

## 2024-04-07 DIAGNOSIS — M5416 Radiculopathy, lumbar region: Secondary | ICD-10-CM

## 2024-04-07 MED ORDER — METHYLPREDNISOLONE ACETATE 40 MG/ML INJ SUSP (RADIOLOG
80.0000 mg | Freq: Once | INTRAMUSCULAR | Status: AC
  Administered 2024-04-07: 80 mg via EPIDURAL

## 2024-04-07 MED ORDER — IOPAMIDOL (ISOVUE-M 200) INJECTION 41%
1.0000 mL | Freq: Once | INTRAMUSCULAR | Status: AC
  Administered 2024-04-07: 1 mL via EPIDURAL

## 2024-04-07 NOTE — Discharge Instructions (Signed)

## 2024-04-22 ENCOUNTER — Encounter: Payer: Self-pay | Admitting: Family Medicine

## 2024-05-07 DIAGNOSIS — Z1231 Encounter for screening mammogram for malignant neoplasm of breast: Secondary | ICD-10-CM | POA: Diagnosis not present
